# Patient Record
Sex: Female | Born: 1955 | Race: White | Hispanic: No | Marital: Single | Smoking: Never smoker
Health system: Southern US, Community
[De-identification: ages and names within clinical notes are randomized; demographics above are authoritative.]

## PROBLEM LIST (undated history)

## (undated) DIAGNOSIS — F419 Anxiety disorder, unspecified: Secondary | ICD-10-CM

## (undated) DIAGNOSIS — M1990 Unspecified osteoarthritis, unspecified site: Secondary | ICD-10-CM

## (undated) DIAGNOSIS — F329 Major depressive disorder, single episode, unspecified: Secondary | ICD-10-CM

## (undated) DIAGNOSIS — M419 Scoliosis, unspecified: Secondary | ICD-10-CM

## (undated) DIAGNOSIS — F32A Depression, unspecified: Secondary | ICD-10-CM

## (undated) HISTORY — PX: ENDOMETRIAL ABLATION: SHX621

---

## 1998-05-25 ENCOUNTER — Emergency Department (HOSPITAL_COMMUNITY): Admission: EM | Admit: 1998-05-25 | Discharge: 1998-05-25 | Payer: Self-pay | Admitting: Emergency Medicine

## 1998-10-22 ENCOUNTER — Ambulatory Visit (HOSPITAL_COMMUNITY): Admission: RE | Admit: 1998-10-22 | Discharge: 1998-10-22 | Payer: Self-pay | Admitting: Obstetrics and Gynecology

## 1998-10-22 ENCOUNTER — Encounter (INDEPENDENT_AMBULATORY_CARE_PROVIDER_SITE_OTHER): Payer: Self-pay | Admitting: Specialist

## 1999-10-14 ENCOUNTER — Ambulatory Visit (HOSPITAL_COMMUNITY): Admission: RE | Admit: 1999-10-14 | Discharge: 1999-10-14 | Payer: Self-pay | Admitting: Psychiatry

## 1999-10-22 ENCOUNTER — Ambulatory Visit (HOSPITAL_COMMUNITY): Admission: RE | Admit: 1999-10-22 | Discharge: 1999-10-22 | Payer: Self-pay | Admitting: Psychiatry

## 1999-11-01 ENCOUNTER — Ambulatory Visit (HOSPITAL_COMMUNITY): Admission: RE | Admit: 1999-11-01 | Discharge: 1999-11-01 | Payer: Self-pay | Admitting: Psychiatry

## 1999-11-15 ENCOUNTER — Ambulatory Visit (HOSPITAL_COMMUNITY): Admission: RE | Admit: 1999-11-15 | Discharge: 1999-11-15 | Payer: Self-pay | Admitting: Psychiatry

## 1999-11-30 ENCOUNTER — Other Ambulatory Visit: Admission: RE | Admit: 1999-11-30 | Discharge: 1999-11-30 | Payer: Self-pay | Admitting: Obstetrics and Gynecology

## 2000-04-04 ENCOUNTER — Other Ambulatory Visit: Admission: RE | Admit: 2000-04-04 | Discharge: 2000-04-04 | Payer: Self-pay | Admitting: Obstetrics and Gynecology

## 2000-11-20 ENCOUNTER — Encounter: Admission: RE | Admit: 2000-11-20 | Discharge: 2000-11-20 | Payer: Self-pay | Admitting: Psychiatry

## 2000-12-18 ENCOUNTER — Encounter: Admission: RE | Admit: 2000-12-18 | Discharge: 2000-12-18 | Payer: Self-pay | Admitting: Psychiatry

## 2000-12-19 ENCOUNTER — Other Ambulatory Visit (HOSPITAL_COMMUNITY): Admission: RE | Admit: 2000-12-19 | Discharge: 2000-12-26 | Payer: Self-pay | Admitting: *Deleted

## 2001-01-01 ENCOUNTER — Encounter: Admission: RE | Admit: 2001-01-01 | Discharge: 2001-01-01 | Payer: Self-pay | Admitting: Psychiatry

## 2001-01-08 ENCOUNTER — Encounter: Admission: RE | Admit: 2001-01-08 | Discharge: 2001-01-08 | Payer: Self-pay | Admitting: *Deleted

## 2001-01-17 ENCOUNTER — Encounter: Admission: RE | Admit: 2001-01-17 | Discharge: 2001-01-17 | Payer: Self-pay | Admitting: *Deleted

## 2001-01-24 ENCOUNTER — Encounter: Admission: RE | Admit: 2001-01-24 | Discharge: 2001-01-24 | Payer: Self-pay | Admitting: *Deleted

## 2001-02-07 ENCOUNTER — Encounter: Admission: RE | Admit: 2001-02-07 | Discharge: 2001-02-07 | Payer: Self-pay | Admitting: *Deleted

## 2001-02-12 ENCOUNTER — Encounter: Admission: RE | Admit: 2001-02-12 | Discharge: 2001-02-12 | Payer: Self-pay | Admitting: *Deleted

## 2001-02-16 ENCOUNTER — Encounter: Admission: RE | Admit: 2001-02-16 | Discharge: 2001-02-16 | Payer: Self-pay | Admitting: *Deleted

## 2001-02-23 ENCOUNTER — Encounter: Admission: RE | Admit: 2001-02-23 | Discharge: 2001-02-23 | Payer: Self-pay | Admitting: *Deleted

## 2001-03-12 ENCOUNTER — Encounter: Admission: RE | Admit: 2001-03-12 | Discharge: 2001-03-12 | Payer: Self-pay | Admitting: *Deleted

## 2001-05-21 ENCOUNTER — Encounter: Admission: RE | Admit: 2001-05-21 | Discharge: 2001-05-21 | Payer: Self-pay | Admitting: Psychiatry

## 2001-06-07 ENCOUNTER — Encounter: Payer: Self-pay | Admitting: Obstetrics and Gynecology

## 2001-06-07 ENCOUNTER — Encounter: Admission: RE | Admit: 2001-06-07 | Discharge: 2001-06-07 | Payer: Self-pay | Admitting: Obstetrics and Gynecology

## 2001-06-20 ENCOUNTER — Encounter (INDEPENDENT_AMBULATORY_CARE_PROVIDER_SITE_OTHER): Payer: Self-pay | Admitting: *Deleted

## 2001-06-20 ENCOUNTER — Ambulatory Visit (HOSPITAL_BASED_OUTPATIENT_CLINIC_OR_DEPARTMENT_OTHER): Admission: RE | Admit: 2001-06-20 | Discharge: 2001-06-20 | Payer: Self-pay | Admitting: General Surgery

## 2001-07-05 ENCOUNTER — Other Ambulatory Visit: Admission: RE | Admit: 2001-07-05 | Discharge: 2001-07-05 | Payer: Self-pay | Admitting: Obstetrics and Gynecology

## 2002-02-12 ENCOUNTER — Encounter: Admission: RE | Admit: 2002-02-12 | Discharge: 2002-02-12 | Payer: Self-pay | Admitting: Psychiatry

## 2002-02-14 ENCOUNTER — Encounter: Admission: RE | Admit: 2002-02-14 | Discharge: 2002-02-14 | Payer: Self-pay | Admitting: Psychiatry

## 2002-02-19 ENCOUNTER — Encounter: Admission: RE | Admit: 2002-02-19 | Discharge: 2002-02-19 | Payer: Self-pay | Admitting: Psychiatry

## 2002-02-20 ENCOUNTER — Other Ambulatory Visit: Admission: RE | Admit: 2002-02-20 | Discharge: 2002-02-20 | Payer: Self-pay | Admitting: Obstetrics and Gynecology

## 2002-02-27 ENCOUNTER — Encounter: Admission: RE | Admit: 2002-02-27 | Discharge: 2002-02-27 | Payer: Self-pay | Admitting: *Deleted

## 2002-03-01 ENCOUNTER — Encounter: Admission: RE | Admit: 2002-03-01 | Discharge: 2002-03-01 | Payer: Self-pay | Admitting: Psychiatry

## 2002-03-11 ENCOUNTER — Encounter: Admission: RE | Admit: 2002-03-11 | Discharge: 2002-03-11 | Payer: Self-pay | Admitting: Psychiatry

## 2002-03-18 ENCOUNTER — Encounter: Admission: RE | Admit: 2002-03-18 | Discharge: 2002-03-18 | Payer: Self-pay | Admitting: Psychiatry

## 2002-04-12 ENCOUNTER — Encounter: Admission: RE | Admit: 2002-04-12 | Discharge: 2002-04-12 | Payer: Self-pay | Admitting: *Deleted

## 2003-06-09 ENCOUNTER — Emergency Department (HOSPITAL_COMMUNITY): Admission: EM | Admit: 2003-06-09 | Discharge: 2003-06-09 | Payer: Self-pay | Admitting: Emergency Medicine

## 2003-12-26 ENCOUNTER — Other Ambulatory Visit: Admission: RE | Admit: 2003-12-26 | Discharge: 2003-12-26 | Payer: Self-pay | Admitting: Obstetrics and Gynecology

## 2005-01-07 ENCOUNTER — Other Ambulatory Visit: Admission: RE | Admit: 2005-01-07 | Discharge: 2005-01-07 | Payer: Self-pay | Admitting: Obstetrics and Gynecology

## 2007-08-06 ENCOUNTER — Emergency Department (HOSPITAL_COMMUNITY): Admission: EM | Admit: 2007-08-06 | Discharge: 2007-08-06 | Payer: Self-pay | Admitting: Emergency Medicine

## 2007-09-19 ENCOUNTER — Emergency Department (HOSPITAL_COMMUNITY): Admission: EM | Admit: 2007-09-19 | Discharge: 2007-09-19 | Payer: Self-pay | Admitting: Emergency Medicine

## 2008-11-12 ENCOUNTER — Emergency Department (HOSPITAL_COMMUNITY): Admission: EM | Admit: 2008-11-12 | Discharge: 2008-11-12 | Payer: Self-pay | Admitting: Emergency Medicine

## 2010-01-31 ENCOUNTER — Encounter: Payer: Self-pay | Admitting: Obstetrics and Gynecology

## 2010-05-28 NOTE — Op Note (Signed)
[place]. [facility]  Patient:    Ann Scott, Ann Scott Visit Number: [id number] MRN: [id number]          Service Type: DSU Location: [facility] Attending Physician:  Caleen Essex Dictated by:   Chevis Pretty, M.D. Proc. Date: 06/20/01 Admit Date:  06/20/2001 Discharge Date: 06/20/2001                             Operative Report  PREOPERATIVE DIAGNOSIS:  Persistent redness of the left nipple.  POSTOPERATIVE DIAGNOSIS: Persistent redness of the left nipple.  OPERATION PERFORMED:  Wedge biopsy of the left nipple.  SURGEON:  Chevis Pretty, M.D.  ANESTHESIA:  Local.  DESCRIPTION OF PROCEDURE:  After informed consent was obtained, the patient was brought to the operating room and placed in supine position on the operating table. After the patients left breast area was prepped with Betadine and draped in the usual sterile manner, the nipple areolar complex was infiltrated with 1% lidocaine with epinephrine.  The reddened area of the nipple was in the upper outer quadrant of the nipple.  This area was incised in the wedge fashion with a 15 blade knife and the specimen was sent to pathology.  A single 5-0 Monocryl stitch was used to reapproximate the edges of the skin and a Dermabond dressing was applied.  The patient tolerated the procedure well.  Sterile dressings were then applied when the Dermabond was dry.  At the end of the case all sponge, needle and instrument counts were correct.  The patient was then awakened and taken to the recovery room in stable condition. Dictated by:   Chevis Pretty, M.D. Attending Physician:  Caleen Essex DD:  06/24/01 TD:  06/26/01 Job: [id number]

## 2010-10-08 LAB — DIFFERENTIAL
Basophils Absolute: 0
Basophils Relative: 0
Eosinophils Absolute: 0.1
Eosinophils Relative: 2
Lymphocytes Relative: 29
Lymphs Abs: 2.6
Monocytes Absolute: 0.5
Monocytes Relative: 6
Neutro Abs: 5.8
Neutrophils Relative %: 64

## 2010-10-08 LAB — COMPREHENSIVE METABOLIC PANEL
ALT: 21
AST: 23
Albumin: 4
Alkaline Phosphatase: 65
BUN: 17
CO2: 27
Calcium: 10.3
Chloride: 104
Creatinine, Ser: 0.84
GFR calc Af Amer: >60
GFR calc non Af Amer: >60
Glucose, Bld: 97
Potassium: 4.2
Sodium: 139
Total Bilirubin: 0.6
Total Protein: 7

## 2010-10-08 LAB — URINALYSIS, ROUTINE W REFLEX MICROSCOPIC
Bilirubin Urine: NEGATIVE
Glucose, UA: NEGATIVE
Hgb urine dipstick: NEGATIVE
Ketones, ur: NEGATIVE
Nitrite: NEGATIVE
Protein, ur: NEGATIVE
Specific Gravity, Urine: 1.02
Urobilinogen, UA: 0.2
pH: 7

## 2010-10-08 LAB — CBC
HCT: 39.9
Hemoglobin: 13.2
MCHC: 33
MCV: 91.1
Platelets: 280
RBC: 4.38
RDW: 12.6
WBC: 9.2

## 2010-10-08 LAB — URINE MICROSCOPIC-ADD ON

## 2011-04-30 ENCOUNTER — Emergency Department (HOSPITAL_COMMUNITY): Payer: Self-pay

## 2011-04-30 ENCOUNTER — Encounter (HOSPITAL_COMMUNITY): Payer: Self-pay

## 2011-04-30 ENCOUNTER — Emergency Department (HOSPITAL_COMMUNITY)
Admission: EM | Admit: 2011-04-30 | Discharge: 2011-05-01 | Disposition: A | Discharge status: Home / Self Care | Payer: Self-pay | Attending: Emergency Medicine | Admitting: Emergency Medicine

## 2011-04-30 ENCOUNTER — Emergency Department (HOSPITAL_COMMUNITY)
Admission: EM | Admit: 2011-04-30 | Discharge: 2011-04-30 | Disposition: A | Discharge status: Home / Self Care | Payer: Self-pay | Attending: Emergency Medicine | Admitting: Emergency Medicine

## 2011-04-30 ENCOUNTER — Encounter (HOSPITAL_COMMUNITY): Payer: Self-pay | Admitting: *Deleted

## 2011-04-30 DIAGNOSIS — S0990XA Unspecified injury of head, initial encounter: Secondary | ICD-10-CM | POA: Insufficient documentation

## 2011-04-30 DIAGNOSIS — Y93E1 Activity, personal bathing and showering: Secondary | ICD-10-CM | POA: Insufficient documentation

## 2011-04-30 DIAGNOSIS — W19XXXA Unspecified fall, initial encounter: Secondary | ICD-10-CM | POA: Insufficient documentation

## 2011-04-30 DIAGNOSIS — R51 Headache: Secondary | ICD-10-CM | POA: Insufficient documentation

## 2011-04-30 DIAGNOSIS — W1809XA Striking against other object with subsequent fall, initial encounter: Secondary | ICD-10-CM | POA: Insufficient documentation

## 2011-04-30 DIAGNOSIS — Y92009 Unspecified place in unspecified non-institutional (private) residence as the place of occurrence of the external cause: Secondary | ICD-10-CM | POA: Insufficient documentation

## 2011-04-30 DIAGNOSIS — F341 Dysthymic disorder: Secondary | ICD-10-CM | POA: Insufficient documentation

## 2011-04-30 HISTORY — DX: Depression, unspecified: F32.A

## 2011-04-30 HISTORY — DX: Major depressive disorder, single episode, unspecified: F32.9

## 2011-04-30 HISTORY — DX: Anxiety disorder, unspecified: F41.9

## 2011-04-30 MED ORDER — IBUPROFEN 800 MG PO TABS: 800.0000 mg | ORAL_TABLET | Freq: Three times a day (TID) | ORAL | Status: AC

## 2011-04-30 NOTE — ED Provider Notes (Signed)
History     CSN: [id number]  Arrival date & time 04/30/11  1129   First MD Initiated Contact with Patient 04/30/11 1143      Chief Complaint  Patient presents with  . Headache  . Fall    (Consider location/radiation/quality/duration/timing/severity/associated sxs/prior treatment) HPI Patient presents to the ER after falling hitting her head this morning in the bath tub. The patient has no LOC. She denies visual changes, N/V, CP, SOB, weakness, neck pain, gait disturbance, dizziness, or numbness. She states that the side of her head feels "like she hit it". The patient states that she did not take anything for her head. She states that nothing seems to make the sensation better or worse. The patient is very anxious about something being wrong with her brain. Past Medical History  Diagnosis Date  . Anxiety   . Depression     Past Surgical History  Procedure Date  . Endometrial ablation     History reviewed. No pertinent family history.  History  Substance Use Topics  . Smoking status: Never Smoker   . Smokeless tobacco: Not on file  . Alcohol Use: No    OB History    Grav Para Term Preterm Abortions TAB SAB Ect Mult Living                  Review of Systems All pertinent positives and negatives reviewed in the history of present illness  Allergies  Sulfa antibiotics  Home Medications   Current Outpatient Rx  Name Route Sig Dispense Refill  . ASPIRIN 81 MG PO CHEW Oral Chew 81 mg by mouth daily.    . ADULT MULTIVITAMIN W/MINERALS CH Oral Take 1 tablet by mouth daily. Nature made      BP 131/76  Pulse 75  Temp(Src) 97.9 F (36.6 C) (Oral)  Resp 18  SpO2 98%  Physical Exam Physical Examination: General appearance - alert, well appearing, and in no distress, oriented to person, place, and time, well hydrated and anxious Mental status - alert, oriented to person, place, and time, normal mood, behavior, speech, dress, motor activity, and thought processes,  anxious Eyes - pupils equal and reactive, extraocular eye movements intact Ears - bilateral TM's and external ear canals normal Nose - normal and patent, no erythema, discharge or polyps Chest - clear to auscultation, no wheezes, rales or rhonchi, symmetric air entry Heart - normal rate, regular rhythm, normal S1, S2, no murmurs, rubs, clicks or gallops Neurological - alert, oriented, normal speech, no focal findings or movement disorder noted, neck supple without rigidity, DTR's normal and symmetric, motor and sensory grossly normal bilaterally, normal muscle tone, no tremors, strength 5/5 Musculoskeletal - no joint tenderness, deformity or swelling  ED Course  Procedures (including critical care time)  Labs Reviewed - No data to display Ct Head Wo Contrast  04/30/2011  *RADIOLOGY REPORT*  Clinical Data:  Larey Seat in bathtub, right-sided head trauma, headache. No loss of consciousness.  CT HEAD WITHOUT CONTRAST  Technique:  Contiguous axial images were obtained from the base of the skull through the vertex without contrast  Comparison:  None.  Findings:  The brain has a normal appearance without evidence for hemorrhage, acute infarction, hydrocephalus, or mass lesion.  There is no extra axial fluid collection.  The skull and paranasal sinuses are normal. Small hypodensity centrally within the brain displaying a pinwheel type pattern is artifactual.  IMPRESSION: Normal CT of the head without contrast.  Original Report Authenticated By: Elsie Stain,  M.D.   The patient has minor head injury. She is very anxious about this. She has no outward signs of trauma on exam. The patient is intact neurologically. She has not visual changes. She will be referred back to her PCP.   The patient has been stable here in the ER. She is asked to return here for any worsening in her condition.  Filed Vitals:   04/30/11 1136 04/30/11 1345  BP: 131/76 113/57  Pulse: 75 71  Temp: 97.9 F (36.6 C)   TempSrc: Oral     Resp: 18 18  SpO2: 98% 100%     MDM  MDM Reviewed: vitals and nursing note Interpretation: CT scan            Carlyle Dolly, PA-C 05/01/11 [phone]

## 2011-04-30 NOTE — Discharge Instructions (Signed)
REturn here as needed. Follow up with your doctor. Your head CT was normal.

## 2011-04-30 NOTE — ED Notes (Signed)
Pt in ED earlier today with fall.  C/O "doesn't feel right".  Says her speech feels difficult.  Modified assessment:  Smile equal, raises eyebrows equal.  Speech clear at this time.  Pt ambulation normal.  Pt walked here from home.  Pt also states could be anxiety from previous fall.  Notified Triage RN of assessment.

## 2011-04-30 NOTE — ED Notes (Signed)
Patient transported to CT 

## 2011-04-30 NOTE — ED Notes (Signed)
Pt accidentally fell in bath tube, hit right side of head, no LOC, on ASA, c/o aching pain 5/10 at right temporal area, no bleeding, hematoma, or nausea

## 2011-04-30 NOTE — ED Notes (Signed)
Pt fell in bathtub, struck R side of head, evaluated for injury earlier. Pt presents w/ c/o "feeling strange" this afternoon.

## 2011-05-01 NOTE — ED Provider Notes (Signed)
History     CSN: [id number]  Arrival date & time 04/30/11  2218   First MD Initiated Contact with Patient 04/30/11 2354      Chief Complaint  Patient presents with  . Head Injury    (Consider location/radiation/quality/duration/timing/severity/associated sxs/prior treatment) HPI  Pt presents to he ED with complaints of head injury. She was seen in the hospital earlier today and had a CT which was normal. She admits to being a very anxious person and being "liberal arts" and not understanding this "science and medicine stuff". She says that she hit her head on the bathtub. She denies LOC. She denies vomiting, confusion and blurry vision at the time. She states that sent leaving the hospital she has felt "weird" and "strange". She states that her head feels "tight". Her head hit on the right parietal side. She walked 2 miles from home to come to the ED to be re-evaluated.   Past Medical History  Diagnosis Date  . Anxiety   . Depression     Past Surgical History  Procedure Date  . Endometrial ablation     History reviewed. No pertinent family history.  History  Substance Use Topics  . Smoking status: Never Smoker   . Smokeless tobacco: Not on file  . Alcohol Use: No    OB History    Grav Para Term Preterm Abortions TAB SAB Ect Mult Living                  Review of Systems  HEENT: denies blurry vision or change in hearing PULMONARY: Denies difficulty breathing and SOB CARDIAC: denies chest pain or heart palpitations MUSCULOSKELETAL:  denies being unable to ambulate ABDOMEN AL: denies abdominal pain GU: denies loss of bowel or urinary control NEURO: denies numbness and tingling in extremities, denies blurry vision, weakness in her extremities.   Allergies  Sulfa antibiotics  Home Medications   Current Outpatient Rx  Name Route Sig Dispense Refill  . ASPIRIN 81 MG PO CHEW Oral Chew 81 mg by mouth daily.    . IBUPROFEN 800 MG PO TABS Oral Take 1 tablet (800  mg total) by mouth 3 (three) times daily. 21 tablet 0  . ADULT MULTIVITAMIN W/MINERALS CH Oral Take 1 tablet by mouth daily. Nature made      BP 125/77  Pulse 71  Temp(Src) 98.1 F (36.7 C) (Oral)  Resp 22  SpO2 100%  Physical Exam  Nursing note and vitals reviewed. Constitutional: She is oriented to person, place, and time. She appears well-developed and well-nourished. No distress.  HENT:  Head: Normocephalic.  Eyes: Pupils are equal, round, and reactive to light.  Neck: Normal range of motion. Neck supple.  Cardiovascular: Normal rate and regular rhythm.   Pulmonary/Chest: Effort normal.  Abdominal: Soft.  Neurological: She is oriented to person, place, and time. She has normal strength. No sensory deficit. Cranial nerve deficit: 3-12 in tact.  Skin: Skin is warm and dry.    ED Course  Procedures (including critical care time)  Labs Reviewed - No data to display Ct Head Wo Contrast  04/30/2011  *RADIOLOGY REPORT*  Clinical Data:  Larey Seat in bathtub, right-sided head trauma, headache. No loss of consciousness.  CT HEAD WITHOUT CONTRAST  Technique:  Contiguous axial images were obtained from the base of the skull through the vertex without contrast  Comparison:  None.  Findings:  The brain has a normal appearance without evidence for hemorrhage, acute infarction, hydrocephalus, or mass lesion.  There  is no extra axial fluid collection.  The skull and paranasal sinuses are normal. Small hypodensity centrally within the brain displaying a pinwheel type pattern is artifactual.  IMPRESSION: Normal CT of the head without contrast.  Original Report Authenticated By: Elsie Stain, M.D.     1. Head injury       MDM   The patient does not require further imaging. She is not showing any red flag symptoms. The patient is very anxious and needed a lot of reassurance. She is simply scared and does not understand that her head CT was normal and that a concussion will leave you feeling a  little odd. Pt ambulatory and has a normal gait. She walks without difficulty.  Pt has been advised of the symptoms that warrant their return to the ED. Patient has voiced understanding and has agreed to follow-up with the PCP or specialist.        Dorthula Matas, PA 05/01/11 [phone]

## 2011-05-01 NOTE — ED Notes (Signed)
EDPA Tiffany in with this pt

## 2011-05-01 NOTE — Discharge Instructions (Signed)
Concussion and Brain Injury A blow or jolt to the head can disrupt the normal function of the brain. This type of brain injury is often called a "concussion" or a "closed head injury." Concussions are usually not life-threatening. Even so, the effects of a concussion can be serious.  CAUSES  A concussion is caused by a blunt blow to the head. The blow might be direct or indirect as described below.  Direct blow (running into another player during a soccer game, being hit in a fight, or hitting your head on a hard surface).   Indirect blow (when your head moves rapidly and violently back and forth like in a car crash).  SYMPTOMS  The brain is very complex. Every head injury is different. Some symptoms may appear right away. Other symptoms may not show up for days or weeks after the concussion. The signs of concussion can be hard to notice. Early on, problems may be missed by patients, family members, and caregivers. You may look fine even though you are acting or feeling differently.  These symptoms are usually temporary, but may last for days, weeks, or even longer. Symptoms include:  Mild headaches that will not go away.   Having more trouble than usual with:   Remembering things.   Paying attention or concentrating.   Organizing daily tasks.   Making decisions and solving problems.   Slowness in thinking, acting, speaking, or reading.   Getting lost or easily confused.   Feeling tired all the time or lacking energy (fatigue).   Feeling drowsy.   Sleep disturbances.   Sleeping more than usual.   Sleeping less than usual.   Trouble falling asleep.   Trouble sleeping (insomnia).   Loss of balance or feeling lightheaded or dizzy.   Nausea or vomiting.   Numbness or tingling.   Increased sensitivity to:   Sounds.   Lights.   Distractions.  Other symptoms might include:  Vision problems or eyes that tire easily.   Diminished sense of taste or smell.   Ringing  in the ears.   Mood changes such as feeling sad, anxious, or listless.   Becoming easily irritated or angry for little or no reason.   Lack of motivation.  DIAGNOSIS  Your caregiver can usually diagnose a concussion or mild brain injury based on your description of your injury and your symptoms.  Your evaluation might include:  A brain scan to look for signs of injury to the brain. Even if the test shows no injury, you may still have a concussion.   Blood tests to be sure other problems are not present.  TREATMENT   People with a concussion need to be examined and evaluated. Most people with concussions are treated in an emergency department, urgent care, or clinic. Some people must stay in the hospital overnight for further treatment.   Your caregiver will send you home with important instructions to follow. Be sure to carefully follow them.   Tell your caregiver if you are already taking any medicines (prescription, over-the-counter, or natural remedies), or if you are drinking alcohol or taking illegal drugs. Also, talk with your caregiver if you are taking blood thinners (anticoagulants) or aspirin. These drugs may increase your chances of complications. All of this is important information that may affect treatment.   Only take over-the-counter or prescription medicines for pain, discomfort, or fever as directed by your caregiver.  PROGNOSIS  How fast people recover from brain injury varies from person to person.   Although most people have a good recovery, how quickly they improve depends on many factors. These factors include how severe their concussion was, what part of the brain was injured, their age, and how healthy they were before the concussion.  Because all head injuries are different, so is recovery. Most people with mild injuries recover fully. Recovery can take time. In general, recovery is slower in older persons. Also, persons who have had a concussion in the past or have  other medical problems may find that it takes longer to recover from their current injury. Anxiety and depression may also make it harder to adjust to the symptoms of brain injury. HOME CARE INSTRUCTIONS  Return to your normal activities slowly, not all at once. You must give your body and brain enough time for recovery.  Get plenty of sleep at night, and rest during the day. Rest helps the brain to heal.   Avoid staying up late at night.   Keep the same bedtime hours on weekends and weekdays.   Take daytime naps or rest breaks when you feel tired.   Limit activities that require a lot of thought or concentration (brain or cognitive rest). This includes:   Homework or job-related work.   Watching TV.   Computer work.   Avoid activities that could lead to a second brain injury, such as contact or recreational sports, until your caregiver says it is okay. Even after your brain injury has healed, you should protect yourself from having another concussion.   Ask your caregiver when you can return to your normal activities such as driving, bicycling, or operating heavy equipment. Your ability to react may be slower after a brain injury.   Talk with your caregiver about when you can return to work or school.   Inform your teachers, school nurse, school counselor, coach, athletic trainer, or work manager about your injury, symptoms, and restrictions. They should be instructed to report:   Increased problems with attention or concentration.   Increased problems remembering or learning new information.   Increased time needed to complete tasks or assignments.   Increased irritability or decreased ability to cope with stress.   Increased symptoms.   Take only those medicines that your caregiver has approved.   Do not drink alcohol until your caregiver says you are well enough to do so. Alcohol and certain other drugs may slow your recovery and can put you at risk of further injury.    If it is harder than usual to remember things, write them down.   If you are easily distracted, try to do one thing at a time. For example, do not try to watch TV while fixing dinner.   Talk with family members or close friends when making important decisions.   Keep all follow-up appointments. Repeated evaluation of your symptoms is recommended for your recovery.  PREVENTION  Protect your head from future injury. It is very important to avoid another head or brain injury before you have recovered. In rare cases, another injury has lead to permanent brain damage, brain swelling, or death. Avoid injuries by using:  Seatbelts when riding in a car.   Alcohol only in moderation.   A helmet when biking, skiing, skateboarding, skating, or doing similar activities.   Safety measures in your home.   Remove clutter and tripping hazards from floors and stairways.   Use grab bars in bathrooms and handrails by stairs.   Place non-slip mats on floors and in bathtubs.     Improve lighting in dim areas.  SEEK MEDICAL CARE IF:  A head injury can cause lingering symptoms. You should seek medical care if you have any of the following symptoms for more than 3 weeks after your injury or are planning to return to sports:  Chronic headaches.   Dizziness or balance problems.   Nausea.   Vision problems.   Increased sensitivity to noise or light.   Depression or mood swings.   Anxiety or irritability.   Memory problems.   Difficulty concentrating or paying attention.   Sleep problems.   Feeling tired all the time.  SEEK IMMEDIATE MEDICAL CARE IF:  You have had a blow or jolt to the head and you (or your family or friends) notice:  Severe or worsening headaches.   Weakness (even if only in one hand or one leg or one part of the face), numbness, or decreased coordination.   Repeated vomiting.   Increased sleepiness or passing out.   One black center of the eye (pupil) is larger  than the other.   Convulsions (seizures).   Slurred speech.   Increasing confusion, restlessness, agitation, or irritability.   Lack of ability to recognize people or places.   Neck pain.   Difficulty being awakened.   Unusual behavior changes.   Loss of consciousness.  Older adults with a brain injury may have a higher risk of serious complications such as a blood clot on the brain. Headaches that get worse or an increase in confusion are signs of this complication. If these signs occur, see a caregiver right away. MAKE SURE YOU:   Understand these instructions.   Will watch your condition.   Will get help right away if you are not doing well or get worse.  FOR MORE INFORMATION  Several groups help people with brain injury and their families. They provide information and put people in touch with local resources. These include support groups, rehabilitation services, and a variety of health care professionals. Among these groups, the Brain Injury Association (BIA, www.biausa.org) has a national office that gathers scientific and educational information and works on a national level to help people with brain injury.  Document Released: 03/19/2003 Document Revised: 12/16/2010 Document Reviewed: 08/15/2007 [employer] Patient Information 2012 [employer]. 

## 2011-05-01 NOTE — ED Provider Notes (Signed)
Medical screening examination/treatment/procedure(s) were performed by non-physician practitioner and as supervising physician I was immediately available for consultation/collaboration.   Drey Shaff L Gaelle Adriance, MD 05/01/11 0812 

## 2011-05-02 NOTE — ED Provider Notes (Signed)
Medical screening examination/treatment/procedure(s) were performed by non-physician practitioner and as supervising physician I was immediately available for consultation/collaboration.    Nelia Shi, MD 05/02/11 [phone]

## 2011-05-26 ENCOUNTER — Telehealth (HOSPITAL_COMMUNITY): Payer: Self-pay | Admitting: *Deleted

## 2011-05-26 ENCOUNTER — Encounter (HOSPITAL_COMMUNITY): Payer: Self-pay | Admitting: *Deleted

## 2011-05-26 ENCOUNTER — Ambulatory Visit (HOSPITAL_COMMUNITY)
Admission: RE | Admit: 2011-05-26 | Discharge: 2011-05-26 | Disposition: A | Discharge status: Home / Self Care | Payer: Self-pay | Attending: Psychiatry | Admitting: Psychiatry

## 2011-05-26 DIAGNOSIS — F39 Unspecified mood [affective] disorder: Secondary | ICD-10-CM | POA: Insufficient documentation

## 2011-05-26 DIAGNOSIS — F411 Generalized anxiety disorder: Secondary | ICD-10-CM | POA: Insufficient documentation

## 2011-05-26 NOTE — BH Assessment (Signed)
Assessment Note   Ann Scott is an 56 y.o. female. Pt presents as walk in today hyper verbal and frantic. Reports she rode in car with sister (Whom she chronically feels belittled by) and mother who was returning from surgery and must stay with sister for 2 days.Pt called her therapist today who recommended she come for evaluation and gave her and appointment for 05/27/2011 1200. Pt having racing thoughts but when redirected could focus. Mildly restless, good memory. reasonable insight and judgement. Great difficulty prioritizing and sorting thoughts. Pt denies any suicidal thoughts but would like to run away and start over, or live somewhere else to avoid how overwhelmed she feels . Pt has financial, housing, job and social problems of long standing. She has many fears about medications, disability and age, potential employer's finding out about her mental health problems. Reports "nervous breakdown" in 2004 and has not worked since. At the time they gave her Effexor which caused palpitations," head going to explode", agitation and slurred speech. Also reports poor reaction to Prozac. 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 Pt has never had HI or psychosis, now or in past. Discussed her options, and pt will keep her appointment with the therapist and get an appointment for a medication evaluation. Pt is aware of 24 hour availability of help at [facility] and ED. Given literature about depression/suicidality.  Axis I: Mood Disorder NOS Axis II: Deferred Axis III:  Past Medical History  Diagnosis Date  . Anxiety   . Depression    Axis IV: economic problems, housing problems, occupational problems and problems with primary support group Axis V: 41-50 serious symptoms  Past Medical History:  Past Medical History  Diagnosis Date  . Anxiety   . Depression     Past Surgical History  Procedure Date  . Endometrial ablation     Family History: No family  history on file.  Social History:  reports that she has never smoked. She has never used smokeless tobacco. She reports that she does not drink alcohol or use illicit drugs.  Additional Social History:  Alcohol / Drug Use Pain Medications: not abusing Prescriptions: not abusing Over the Counter: asa History of alcohol / drug use?: No history of alcohol / drug abuse Allergies:  Allergies  Allergen Reactions  . Effexor (Venlafaxine) Anxiety, Palpitations and Other (See Comments)    Slurred speech  . Prozac (Fluoxetine) Itching and Anxiety  . Sulfa Antibiotics Itching and Rash    Home Medications:  (Not in a hospital admission)  OB/GYN Status:  No LMP recorded. Patient is postmenopausal.  General Assessment Data Location of Assessment: [facility] Services Living Arrangements: Alone Can pt return to current living arrangement?: Yes Referral Source: Other Dentist, Amanda)  Education Status Is patient currently in school?: No Highest grade of school patient has completed: BA Contact person: mother  Risk to self Suicidal Ideation: No-Not Currently/Within Last 6 Months (fleeting thoughts to give up, run away) Suicidal Intent: No-Not Currently/Within Last 6 Months Is patient at risk for suicide?: No Suicidal Plan?: No Access to Means: No What has been your use of drugs/alcohol within the last 12 months?: none Previous Attempts/Gestures: No How many times?: 0  Other Self Harm Risks: 0 Triggers for Past Attempts: Family contact Intentional Self Injurious Behavior: None Family Suicide History: No Recent stressful life event(s): Conflict (Comment) (sister is demeaning) Persecutory voices/beliefs?: No Depression: Yes Depression Symptoms: Fatigue;Guilt;Feeling worthless/self pity Substance abuse history and/or  treatment for substance abuse?: No Suicide prevention information given to non-admitted patients: Yes  Risk to Others Homicidal Ideation: No Thoughts of Harm  to Others: No Current Homicidal Intent: No Current Homicidal Plan: No Access to Homicidal Means: No History of harm to others?: No Assessment of Violence: None Noted Does patient have access to weapons?: No Criminal Charges Pending?: No Does patient have a court date: No  Psychosis Hallucinations: None noted Delusions: None noted  Mental Status Report Appear/Hygiene: Other (Comment) (clean, casual but non descript) Eye Contact: Good Motor Activity: Restlessness Speech: Rapid;Logical/coherent Level of Consciousness: Alert Mood: Anxious;Apprehensive Affect: Anxious;Apprehensive Anxiety Level: Moderate Thought Processes: Coherent;Relevant (mildly racing) Judgement: Impaired Orientation: Person;Place;Time;Situation Obsessive Compulsive Thoughts/Behaviors: Minimal  Cognitive Functioning Concentration: Decreased Memory: Recent Intact;Remote Intact IQ: Average Insight: Fair Impulse Control: Good Appetite: Good Weight Loss: 0  Weight Gain: 0  Sleep: No Change Total Hours of Sleep: 6  Vegetative Symptoms: None  Prior Inpatient Therapy Prior Inpatient Therapy: No  Prior Outpatient Therapy Prior Outpatient Therapy: Yes Prior Therapy Dates: current Prior Therapy Facilty/Provider(s): [facility] Reason for Treatment: anxiety depression  ADL Screening (condition at time of admission) Patient's cognitive ability adequate to safely complete daily activities?: Yes Patient able to express need for assistance with ADLs?: Yes Independently performs ADLs?: Yes Weakness of Legs: None Weakness of Arms/Hands: None  Home Assistive Devices/Equipment Home Assistive Devices/Equipment: Eyeglasses    Abuse/Neglect Assessment (Assessment to be complete while patient is alone) Physical Abuse: Denies Verbal Abuse: Yes, present (Comment) (sister is demeaning) Sexual Abuse: Denies Exploitation of patient/patient's resources: Denies Self-Neglect: Denies       Nutrition  Screen Diet: Regular Unintentional weight loss greater than 10lbs within the last month: No Problems chewing or swallowing foods and/or liquids: No Home Tube Feeding or Total Parenteral Nutrition (TPN): No Patient appears severely malnourished: No Pregnant or Lactating: No  Additional Information 1:1 In Past 12 Months?: No CIRT Risk: No Elopement Risk: No Does patient have medical clearance?: No     Disposition:  Disposition Disposition of Patient: Outpatient treatment;Other dispositions Type of outpatient treatment: Adult Other disposition(s): To current provider;Other (Comment) (request to see medication provider)  On Site Evaluation by:   Reviewed with Physician:     Conan Bowens 05/26/2011 7:54 PM

## 2011-10-01 ENCOUNTER — Ambulatory Visit (INDEPENDENT_AMBULATORY_CARE_PROVIDER_SITE_OTHER): Payer: Self-pay | Admitting: Internal Medicine

## 2011-10-01 VITALS — BP 131/73 | HR 57 | Temp 98.8°F | Resp 18 | Ht 62.0 in | Wt 125.0 lb

## 2011-10-01 DIAGNOSIS — Z0289 Encounter for other administrative examinations: Secondary | ICD-10-CM

## 2011-10-01 NOTE — Progress Notes (Signed)
  Subjective:    Patient ID: Ann Scott, female    DOB: 11-10-1955, 56 y.o.   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 Were you born outside the [place] in one of the following parts of the world:    [place], [place], [place], [place] or [place]?  No  2. Have you traveled outside the [place] and lived for more than one month in one of the following parts of the world:  [place], [place], [place], [place] or [place]?  No  3. Do you have a compromised immune system such as from any of the following conditions:  HIV/AIDS, organ or bone marrow transplantation, diabetes, immunosuppressive   medicines (e.g. Prednisone, Remicaide), leukemia, lymphoma, cancer of the   head or neck, gastrectomy or jejunal bypass, end-stage renal disease (on   dialysis), or silicosis?  No    4. Have you ever done one of the following:    Used crack cocaine, injected illegal drugs, worked or resided in jail or prison,   worked or resided at a homeless shelter, or worked as a Research scientist (physical sciences) in   direct contact with patients?  No  5. Have you ever been exposed to anyone with infectious tuberculosis?  No Tuberculosis Symptom Questionnaire  Do you currently have any of the following symptoms?  1. Unexplained cough lasting more than 3 weeks? No  Unexplained fever lasting more than 3 weeks. No   3. Night Sweats (sweating that leaves the bedclothes and sheets wet)   No  4. Shortness of Breath No  5. Chest Pain No  6. Unintentional weight loss  No  7. 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

## 2011-12-23 ENCOUNTER — Other Ambulatory Visit: Payer: Self-pay | Admitting: Internal Medicine

## 2011-12-23 DIAGNOSIS — Z1231 Encounter for screening mammogram for malignant neoplasm of breast: Secondary | ICD-10-CM

## 2012-01-30 ENCOUNTER — Ambulatory Visit: Payer: Self-pay

## 2012-07-11 ENCOUNTER — Ambulatory Visit: Payer: Self-pay | Attending: Family Medicine | Admitting: Internal Medicine

## 2012-07-11 VITALS — BP 125/71 | HR 70 | Temp 98.0°F | Resp 16 | Ht 64.0 in | Wt 139.2 lb

## 2012-07-11 DIAGNOSIS — Z0000 Encounter for general adult medical examination without abnormal findings: Secondary | ICD-10-CM

## 2012-07-11 LAB — CBC WITH DIFFERENTIAL/PLATELET
Basophils Absolute: 0 10*3/uL (ref 0.0–0.1)
Basophils Relative: 1 % (ref 0–1)
Eosinophils Absolute: 0.1 10*3/uL (ref 0.0–0.7)
Eosinophils Relative: 2 % (ref 0–5)
MCH: 29.7 pg (ref 26.0–34.0)
MCHC: 34.9 g/dL (ref 30.0–36.0)
MCV: 84.9 fL (ref 78.0–100.0)
Monocytes Absolute: 0.5 10*3/uL (ref 0.1–1.0)
Platelets: 330 10*3/uL (ref 150–400)
RDW: 13.8 % (ref 11.5–15.5)
WBC: 7.2 10*3/uL (ref 4.0–10.5)

## 2012-07-11 LAB — BASIC METABOLIC PANEL
BUN: 21 mg/dL (ref 6–23)
Calcium: 10.3 mg/dL (ref 8.4–10.5)
Creat: 1.2 mg/dL — ABNORMAL HIGH (ref 0.50–1.10)

## 2012-07-11 LAB — LIPID PANEL: Cholesterol: 172 mg/dL (ref 0–200)

## 2012-07-11 LAB — HEMOGLOBIN A1C: Mean Plasma Glucose: 108 mg/dL (ref <117)

## 2012-07-11 MED ORDER — CIPROFLOXACIN HCL 500 MG PO TABS: 500.0000 mg | ORAL_TABLET | Freq: Two times a day (BID) | ORAL | Status: DC

## 2012-07-11 NOTE — Progress Notes (Unsigned)
Patient ID: Ann Scott, female   DOB: September 29, 1955, 57 y.o.   MRN: [id number]  CC:  HPI: 57 year old female who presents for routine physical She is applying for a teaching job. The patient brings in a form to be filled out. The patient is describing Very poor financial situation. She is out of a job. She plans to visit [place] to meet up with an old friend She is currently not sexually active but feels that every time she does get sexually active the patient develops a UTI and is requesting a prescription for ciprofloxacin prophylactically. She is otherwise very healthy. She is up-to-date with her hepatitis B vaccination, she received a DTaP in 2007.   Allergies  Allergen Reactions  . Effexor (Venlafaxine) Anxiety, Palpitations and Other (See Comments)    Slurred speech  . Prozac (Fluoxetine) Itching and Anxiety  . Sulfa Antibiotics Itching and Rash   Past Medical History  Diagnosis Date  . Anxiety   . Depression    Current Outpatient Prescriptions on File Prior to Visit  Medication Sig Dispense Refill  . aspirin 81 MG chewable tablet Chew 81 mg by mouth daily.      . Multiple Vitamin (MULITIVITAMIN WITH MINERALS) TABS Take 1 tablet by mouth daily. Nature made       No current facility-administered medications on file prior to visit.   History reviewed. No pertinent family history. History   Social History  . Marital Status: Single    Spouse Name: N/A    Number of Children: N/A  . Years of Education: N/A   Occupational History  . Not on file.   Social History Main Topics  . Smoking status: Never Smoker   . Smokeless tobacco: Never Used  . Alcohol Use: No  . Drug Use: No  . Sexually Active: No   Other Topics Concern  . Not on file   Social History Narrative  . No narrative on file    Review of Systems  Constitutional: Negative for fever, chills, diaphoresis, activity change, appetite change and fatigue.  HENT: Negative for ear pain, nosebleeds, congestion,  facial swelling, rhinorrhea, neck pain, neck stiffness and ear discharge.   Eyes: Negative for pain, discharge, redness, itching and visual disturbance.  Respiratory: Negative for cough, choking, chest tightness, shortness of breath, wheezing and stridor.   Cardiovascular: Negative for chest pain, palpitations and leg swelling.  Gastrointestinal: Negative for abdominal distention.  Genitourinary: Negative for dysuria, urgency, frequency, hematuria, flank pain, decreased urine volume, difficulty urinating and dyspareunia.  Musculoskeletal: Negative for back pain, joint swelling, arthralgias and gait problem.  Neurological: Negative for dizziness, tremors, seizures, syncope, facial asymmetry, speech difficulty, weakness, light-headedness, numbness and headaches.  Hematological: Negative for adenopathy. Does not bruise/bleed easily.  Psychiatric/Behavioral: Negative for hallucinations, behavioral problems, confusion, dysphoric mood, decreased concentration and agitation.    Objective:   Filed Vitals:   07/11/12 1225  BP: 125/71  Pulse: 70  Temp: 98 F (36.7 C)  Resp: 16    Physical Exam  Constitutional: Appears well-developed and well-nourished. No distress.  HENT: Normocephalic. External right and left ear normal. Oropharynx is clear and moist.  Eyes: Conjunctivae and EOM are normal. PERRLA, no scleral icterus.  Neck: Normal ROM. Neck supple. No JVD. No tracheal deviation. No thyromegaly.  CVS: RRR, S1/S2 +, no murmurs, no gallops, no carotid bruit.  Pulmonary: Effort and breath sounds normal, no stridor, rhonchi, wheezes, rales.  Abdominal: Soft. BS +,  no distension, tenderness, rebound or guarding.  Musculoskeletal: Normal  range of motion. No edema and no tenderness.  Lymphadenopathy: No lymphadenopathy noted, cervical, inguinal. Neuro: Alert. Normal reflexes, muscle tone coordination. No cranial nerve deficit. Skin: Skin is warm and dry. No rash noted. Not diaphoretic. No  erythema. No pallor.  Psychiatric: Normal mood and affect. Behavior, judgment, thought content normal.   Lab Results  Component Value Date   WBC 9.2 08/06/2007   HGB 13.2 08/06/2007   HCT 39.9 08/06/2007   MCV 91.1 08/06/2007   PLT 280 08/06/2007   Lab Results  Component Value Date   CREATININE 0.84 08/06/2007   BUN 17 08/06/2007   NA 139 08/06/2007   K 4.2 08/06/2007   CL 104 08/06/2007   CO2 27 08/06/2007    No results found for this basename: HGBA1C   Lipid Panel  No results found for this basename: chol, trig, hdl, cholhdl, vldl, ldlcalc       Assessment and plan:   There are no active problems to display for this patient.  Routine physical exam #1 we'll provide a TdaP vaccine #2 obtain a hepatitis B antibody titer dementia the patient still protected #3 obtain routine blood work for CBC BMP, hemoglobin A1c, lipid panel #4 patient has been provided with a prescription for ciprofloxacin to be used prophylactically.  #5 patient instructed to call to obtain results of her TB test  #6 routine followup in 2 months

## 2012-07-11 NOTE — Progress Notes (Unsigned)
Patient here to establish care and get a physical

## 2012-07-11 NOTE — Patient Instructions (Signed)
Patient instructed to call in 2 days to obtain results of her TB test

## 2012-07-12 LAB — HEPATITIS B CORE ANTIBODY, TOTAL: Hep B Core Total Ab: NEGATIVE

## 2012-07-13 LAB — QUANTIFERON TB GOLD ASSAY (BLOOD)
Interferon Gamma Release Assay: NEGATIVE
Quantiferon Nil Value: 0.03 IU/mL
TB Ag value: 0.19 IU/mL

## 2012-07-24 ENCOUNTER — Telehealth: Payer: Self-pay | Admitting: Family Medicine

## 2012-07-24 NOTE — Telephone Encounter (Signed)
Pt called to ask a question about vaginal discomfort during intercourse and wanted to know if there was an OTC medication she could use to relieve her issue.

## 2012-07-24 NOTE — Telephone Encounter (Signed)
Patient is out of town- was inquiring about vaginal lubricant Since menopause very dry in her vaginal area i instructed her to speak with pharmacist in the area she is staying

## 2012-09-11 ENCOUNTER — Ambulatory Visit: Payer: Self-pay

## 2013-04-01 ENCOUNTER — Ambulatory Visit: Payer: Self-pay

## 2013-05-23 ENCOUNTER — Ambulatory Visit: Payer: No Typology Code available for payment source | Attending: Internal Medicine

## 2013-06-12 ENCOUNTER — Ambulatory Visit: Payer: Self-pay | Admitting: Internal Medicine

## 2013-06-18 ENCOUNTER — Ambulatory Visit: Payer: No Typology Code available for payment source | Attending: Internal Medicine | Admitting: Internal Medicine

## 2013-06-18 ENCOUNTER — Encounter: Payer: Self-pay | Admitting: Internal Medicine

## 2013-06-18 VITALS — BP 119/67 | HR 71 | Temp 97.9°F | Resp 12 | Ht 64.0 in | Wt 131.4 lb

## 2013-06-18 DIAGNOSIS — R5381 Other malaise: Secondary | ICD-10-CM | POA: Insufficient documentation

## 2013-06-18 DIAGNOSIS — Z0289 Encounter for other administrative examinations: Secondary | ICD-10-CM | POA: Insufficient documentation

## 2013-06-18 DIAGNOSIS — Z0000 Encounter for general adult medical examination without abnormal findings: Secondary | ICD-10-CM

## 2013-06-18 DIAGNOSIS — M4120 Other idiopathic scoliosis, site unspecified: Secondary | ICD-10-CM | POA: Insufficient documentation

## 2013-06-18 DIAGNOSIS — R5383 Other fatigue: Secondary | ICD-10-CM

## 2013-06-18 DIAGNOSIS — L659 Nonscarring hair loss, unspecified: Secondary | ICD-10-CM | POA: Insufficient documentation

## 2013-06-18 NOTE — Progress Notes (Signed)
Patient ID: Ann Scott, female   DOB: [birth year], 58 y.o.   MRN: [id number]  CC: physical  HPI:  Patient is applying for a substitute teaching position and would like to have a physical form filled out.  Patient has a history of scoliosis which causes her back pain.  She wants documentation that states has scoliosis and that she cannot perform heavy lifting.  She states her hair is thin and dry and has been coming out for a few years.  She also reports that she has been fatigued for 5 years.    Allergies  Allergen Reactions  . Effexor [Venlafaxine] Anxiety, Palpitations and Other (See Comments)    Slurred speech  . Prozac [Fluoxetine] Itching and Anxiety  . Sulfa Antibiotics Itching and Rash   Past Medical History  Diagnosis Date  . Anxiety   . Depression    Current Outpatient Prescriptions on File Prior to Visit  Medication Sig Dispense Refill  . aspirin 81 MG chewable tablet Chew 81 mg by mouth daily.      . Multiple Vitamin (MULITIVITAMIN WITH MINERALS) TABS Take 1 tablet by mouth daily. Nature made      . ciprofloxacin (CIPRO) 500 MG tablet Take 1 tablet (500 mg total) by mouth 2 (two) times daily.  20 tablet  3   No current facility-administered medications on file prior to visit.   Family History  Problem Relation Age of Onset  . Heart disease Mother   . Cancer Father    History   Social History  . Marital Status: Single    Spouse Name: N/A    Number of Children: N/A  . Years of Education: N/A   Occupational History  . Not on file.   Social History Main Topics  . Smoking status: Never Smoker   . Smokeless tobacco: Never Used  . Alcohol Use: No  . Drug Use: No  . Sexual Activity: No   Other Topics Concern  . Not on file   Social History Narrative  . No narrative on file    Review of Systems  Constitutional: Negative.   HENT: Negative.   Eyes: Negative.   Respiratory: Negative.   Cardiovascular: Negative.   Gastrointestinal: Negative.    Genitourinary: Negative.   Musculoskeletal: Positive for back pain.  Skin: Negative.   Neurological: Negative.   Endo/Heme/Allergies: Negative for environmental allergies and polydipsia. Does not bruise/bleed easily.       Change in hair texture   Psychiatric/Behavioral: Negative.       Objective:   Filed Vitals:   06/18/13 1554  BP: 119/67  Pulse: 71  Temp: 97.9 F (36.6 C)  Resp: 12    Physical Exam: Constitutional: Patient appears well-developed and well-nourished. No distress. HENT: Normocephalic, atraumatic, External right and left ear normal. Oropharynx is clear and moist.  Eyes: Conjunctivae and EOM are normal. PERRLA, no scleral icterus. Neck: Normal ROM. Neck supple. No JVD. No tracheal deviation. No thyromegaly. CVS: RRR, S1/S2 +, no murmurs, no gallops, no carotid bruit.  Pulmonary: Effort and breath sounds normal, no stridor, rhonchi, wheezes, rales.  Abdominal: Soft. BS +,  no distension, tenderness, rebound or guarding.  Musculoskeletal: Normal range of motion. No edema and no tenderness. Severe scoliosis noted.   Lymphadenopathy: No lymphadenopathy noted, cervical, inguinal or axillary Neuro: Alert. Normal reflexes, muscle tone coordination. No cranial nerve deficit. Skin: Skin is warm and dry. No rash noted. Not diaphoretic. No erythema. No pallor. Psychiatric: Normal mood and affect. Behavior, judgment, thought  content normal.  Lab Results  Component Value Date   WBC 7.2 07/11/2012   HGB 13.2 07/11/2012   HCT 37.8 07/11/2012   MCV 84.9 07/11/2012   PLT 330 07/11/2012   Lab Results  Component Value Date   CREATININE 1.20* 07/11/2012   BUN 21 07/11/2012   NA 139 07/11/2012   K 4.4 07/11/2012   CL 104 07/11/2012   CO2 24 07/11/2012    Lab Results  Component Value Date   HGBA1C 5.4 07/11/2012   Lipid Panel     Component Value Date/Time   CHOL 172 07/11/2012 1257   TRIG 66 07/11/2012 1257   HDL 54 07/11/2012 1257   CHOLHDL 3.2 07/11/2012 1257   VLDL 13 07/11/2012 1257    LDLCALC 105* 07/11/2012 1257       Assessment and plan:   Ann Scott was seen today for scoliosis and back pain.  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   Hair loss       Ambrose FinlandValerie A Keck, NP-C [facility] [phone] 06/19/2013, 10:40 AM

## 2013-06-18 NOTE — Progress Notes (Signed)
Patient is here for documentation related to scoliosis and potential jobs. Also, requesting health certificate filled out for school system.

## 2013-06-19 LAB — TSH: TSH: 2.163 u[IU]/mL (ref 0.350–4.500)

## 2013-06-20 ENCOUNTER — Telehealth: Payer: Self-pay | Admitting: *Deleted

## 2013-06-20 ENCOUNTER — Encounter: Payer: Self-pay | Admitting: *Deleted

## 2013-06-20 NOTE — Telephone Encounter (Signed)
Message copied by Raynelle Chary on Thu Jun 20, 2013  3:07 PM ------      Message from: [place] A      Created: Wed Jun 19, 2013 11:09 AM       Let patient know her thyroid levels are normal ------

## 2013-06-20 NOTE — Progress Notes (Signed)
Pt came in today and I told her that her tyroid levels were normal.

## 2013-06-20 NOTE — Telephone Encounter (Signed)
Left message to return my call.  

## 2013-06-24 ENCOUNTER — Telehealth: Payer: Self-pay | Admitting: Internal Medicine

## 2013-06-24 ENCOUNTER — Ambulatory Visit: Payer: No Typology Code available for payment source | Attending: Internal Medicine

## 2013-06-24 DIAGNOSIS — Z0000 Encounter for general adult medical examination without abnormal findings: Secondary | ICD-10-CM

## 2013-06-24 NOTE — Telephone Encounter (Signed)
Pt is experiencing bleeding gums and says she needs a dental referral. Pt's last office visit was on 06/18/13, pls f/u with pt.

## 2013-06-24 NOTE — Patient Instructions (Signed)
Return Wednesday for results

## 2013-06-24 NOTE — Progress Notes (Signed)
Pt came in for a TB test only. successful wheel.

## 2013-06-25 ENCOUNTER — Encounter: Payer: Self-pay | Admitting: Emergency Medicine

## 2013-06-25 ENCOUNTER — Telehealth: Payer: Self-pay | Admitting: Emergency Medicine

## 2013-06-25 DIAGNOSIS — K068 Other specified disorders of gingiva and edentulous alveolar ridge: Secondary | ICD-10-CM

## 2013-06-25 NOTE — Telephone Encounter (Signed)
Yes, make sure patient is exercising goof dental hygiene such as flossing daily and brushing after every mea. You may also send give patient list of low cost dentist.

## 2013-06-25 NOTE — Telephone Encounter (Signed)
Left message for pt to call in regards to dental referral Dental referral placed

## 2013-06-25 NOTE — Telephone Encounter (Signed)
Is it ok to refer pt to dental due to bleeding gums? Please f/u

## 2013-06-26 ENCOUNTER — Ambulatory Visit: Payer: No Typology Code available for payment source | Attending: Internal Medicine

## 2013-06-26 ENCOUNTER — Encounter: Payer: Self-pay | Admitting: *Deleted

## 2013-06-26 DIAGNOSIS — Z0000 Encounter for general adult medical examination without abnormal findings: Secondary | ICD-10-CM

## 2013-06-26 LAB — TB SKIN TEST
INDURATION: 0 mm
TB Skin Test: NEGATIVE

## 2013-09-25 ENCOUNTER — Ambulatory Visit: Payer: No Typology Code available for payment source | Attending: Internal Medicine

## 2013-10-02 ENCOUNTER — Ambulatory Visit: Payer: Self-pay | Admitting: Internal Medicine

## 2013-10-17 ENCOUNTER — Telehealth: Payer: Self-pay | Admitting: Emergency Medicine

## 2013-10-17 ENCOUNTER — Telehealth: Payer: Self-pay | Admitting: Internal Medicine

## 2013-10-17 ENCOUNTER — Other Ambulatory Visit: Payer: Self-pay | Admitting: Emergency Medicine

## 2013-10-17 DIAGNOSIS — M546 Pain in thoracic spine: Secondary | ICD-10-CM

## 2013-10-17 NOTE — Telephone Encounter (Signed)
Please f/u    Pt is needing to make an appointment with a back specialist who is in the [employer] for purposes of disability. Her court hearing is nov 13th but the lawyer needs the paperwork at least 2 weeks or so ahead of time to process everything accordingly. She mentioned that she already has been diagnosed with scoliosis and was hoping to get this referral with out making an appt to save time so that she can get her paperwork in on time. Please follow up with pt.

## 2013-10-17 NOTE — Telephone Encounter (Signed)
Pt is needing to make an appointment with a back specialist who is in the [employer] for purposes of disability. Her court hearing is nov 13th but the lawyer needs the paperwork at least 2 weeks or so ahead of time to process everything accordingly. She mentioned that she already has been diagnosed with scoliosis and was hoping to get this referral with out making an appt to save time so that she can get her paperwork in on time. Please follow up with pt.

## 2013-10-17 NOTE — Telephone Encounter (Signed)
You may send referral to sports medicine but I believe they have stopped taking our patients

## 2013-10-17 NOTE — Telephone Encounter (Signed)
Left message for pt that we placed referral for Sports Medicine

## 2013-11-03 ENCOUNTER — Emergency Department (HOSPITAL_COMMUNITY)
Admission: EM | Admit: 2013-11-03 | Discharge: 2013-11-03 | Disposition: A | Discharge status: Home / Self Care | Payer: No Typology Code available for payment source | Attending: Emergency Medicine | Admitting: Emergency Medicine

## 2013-11-03 ENCOUNTER — Encounter (HOSPITAL_COMMUNITY): Payer: Self-pay | Admitting: Emergency Medicine

## 2013-11-03 DIAGNOSIS — Z79899 Other long term (current) drug therapy: Secondary | ICD-10-CM | POA: Insufficient documentation

## 2013-11-03 DIAGNOSIS — R200 Anesthesia of skin: Secondary | ICD-10-CM | POA: Insufficient documentation

## 2013-11-03 DIAGNOSIS — Z7982 Long term (current) use of aspirin: Secondary | ICD-10-CM | POA: Insufficient documentation

## 2013-11-03 DIAGNOSIS — M545 Low back pain: Secondary | ICD-10-CM | POA: Insufficient documentation

## 2013-11-03 DIAGNOSIS — G8929 Other chronic pain: Secondary | ICD-10-CM

## 2013-11-03 DIAGNOSIS — M549 Dorsalgia, unspecified: Secondary | ICD-10-CM

## 2013-11-03 DIAGNOSIS — Z8659 Personal history of other mental and behavioral disorders: Secondary | ICD-10-CM | POA: Insufficient documentation

## 2013-11-03 MED ORDER — KETOROLAC TROMETHAMINE 60 MG/2ML IM SOLN
60.0000 mg | Freq: Once | INTRAMUSCULAR | Status: AC
  Administered 2013-11-03: 60 mg via INTRAMUSCULAR
  Filled 2013-11-03: qty 2

## 2013-11-03 NOTE — ED Notes (Addendum)
Pt c/o generalized back pain and intermittent BLE numbness and tingling x 4 days, SOB w/ strenuous activity x "years", and increasing depression x "years."  Pain score 4/10, increasing w/ activity.  Pt reports that she did not take any pain medication today.  Hx of scoliosis.  Pt sts "my life has just kinda fell apart over the last couple weeks."  Denies SI/HI/Hallucinations.

## 2013-11-03 NOTE — Discharge Instructions (Signed)

## 2013-11-03 NOTE — ED Provider Notes (Signed)
CSN: [id number]     Arrival date & time 11/03/13  1202 History   First MD Initiated Contact with Patient 11/03/13 1352     Chief Complaint  Patient presents with  . Back Pain  . Leg numbness      (Consider location/radiation/quality/duration/timing/severity/associated sxs/prior Treatment) HPI Comments: Patient here complaining of worsening chronic back pain secondary to scoliosis. Denies any new injuries at this time. Pain is localized to her lower back and characterized as dull and worse with certain positions. They are worse with walking and better with rest. Denies any bowel or bladder dysfunction. No saddle anesthesias. Some numbness intermittently down her legs. Symptoms persisted and she has been using Tylenol and aspirin without relief.  Patient is a 58 y.o. female presenting with back pain. The history is provided by the patient.  Back Pain   Past Medical History  Diagnosis Date  . Anxiety   . Depression    Past Surgical History  Procedure Laterality Date  . Endometrial ablation     Family History  Problem Relation Age of Onset  . Heart disease Mother   . Cancer Father    History  Substance Use Topics  . Smoking status: Never Smoker   . Smokeless tobacco: Never Used  . Alcohol Use: No   OB History   Grav Para Term Preterm Abortions TAB SAB Ect Mult Living                 Review of Systems  Musculoskeletal: Positive for back pain.  All other systems reviewed and are negative.     Allergies  Effexor; Prozac; and Sulfa antibiotics  Home Medications   Prior to Admission medications   Medication Sig Start Date End Date Taking? Authorizing Provider  aspirin EC 81 MG tablet Take 81 mg by mouth 3 (three) times daily as needed for mild pain.    Yes Historical Provider, MD  Multiple Vitamin (MULITIVITAMIN WITH MINERALS) TABS Take 1 tablet by mouth daily.    Yes Historical Provider, MD  vitamin C (ASCORBIC ACID) 500 MG tablet Take 500 mg by mouth daily. 03/18/13   Yes Historical Provider, MD   BP 127/74  Pulse 65  Temp(Src) 98.1 F (36.7 C) (Oral)  Resp 18  SpO2 99% Physical Exam  Nursing note and vitals reviewed. Constitutional: She is oriented to person, place, and time. She appears well-developed and well-nourished.  Non-toxic appearance. No distress.  HENT:  Head: Normocephalic and atraumatic.  Eyes: Conjunctivae, EOM and lids are normal. Pupils are equal, round, and reactive to light.  Neck: Normal range of motion. Neck supple. No tracheal deviation present. No mass present.  Cardiovascular: Normal rate, regular rhythm and normal heart sounds.  Exam reveals no gallop.   No murmur heard. Pulmonary/Chest: Effort normal and breath sounds normal. No stridor. No respiratory distress. She has no decreased breath sounds. She has no wheezes. She has no rhonchi. She has no rales.  Abdominal: Soft. Normal appearance and bowel sounds are normal. She exhibits no distension. There is no tenderness. There is no rebound and no CVA tenderness.  Musculoskeletal: Normal range of motion. She exhibits no edema and no tenderness.       Back:  Neurological: She is alert and oriented to person, place, and time. She has normal strength. No cranial nerve deficit or sensory deficit. GCS eye subscore is 4. GCS verbal subscore is 5. GCS motor subscore is 6.  Skin: Skin is warm and dry. No abrasion and no rash  noted.  Psychiatric: She has a normal mood and affect. Her speech is normal and behavior is normal.    ED Course  Procedures (including critical care time) Labs Review Labs Reviewed - No data to display  Imaging Review No results found.   EKG Interpretation None      MDM   Final diagnoses:  None    Patient ambulatory here in the department without signs of foot drop. Pain is chronic in nature. Patient given dose of Toradol IM here and will be [facility].no concern for cauda equina or acute neurological  process    Toy BakerAnthony T Meggie Laseter, MD 11/03/13 [phone]

## 2013-11-03 NOTE — ED Notes (Signed)
Bed: WA04 Expected date:  Expected time:  Means of arrival:  Comments: Triage 4 

## 2013-11-06 ENCOUNTER — Telehealth: Payer: Self-pay | Admitting: Internal Medicine

## 2013-11-15 ENCOUNTER — Telehealth: Payer: Self-pay | Admitting: Internal Medicine

## 2013-11-15 ENCOUNTER — Ambulatory Visit: Payer: No Typology Code available for payment source | Attending: Internal Medicine | Admitting: Internal Medicine

## 2013-11-15 ENCOUNTER — Encounter: Payer: Self-pay | Admitting: Internal Medicine

## 2013-11-15 ENCOUNTER — Ambulatory Visit (HOSPITAL_COMMUNITY)
Admission: RE | Admit: 2013-11-15 | Discharge: 2013-11-15 | Disposition: A | Discharge status: Home / Self Care | Payer: No Typology Code available for payment source | Source: Ambulatory Visit | Attending: Internal Medicine | Admitting: Internal Medicine

## 2013-11-15 VITALS — BP 129/79 | HR 77 | Temp 97.5°F | Resp 16 | Wt 130.0 lb

## 2013-11-15 DIAGNOSIS — Z7982 Long term (current) use of aspirin: Secondary | ICD-10-CM | POA: Insufficient documentation

## 2013-11-15 DIAGNOSIS — M5489 Other dorsalgia: Secondary | ICD-10-CM

## 2013-11-15 DIAGNOSIS — Z2821 Immunization not carried out because of patient refusal: Secondary | ICD-10-CM

## 2013-11-15 DIAGNOSIS — M419 Scoliosis, unspecified: Secondary | ICD-10-CM

## 2013-11-15 DIAGNOSIS — Z8249 Family history of ischemic heart disease and other diseases of the circulatory system: Secondary | ICD-10-CM | POA: Insufficient documentation

## 2013-11-15 DIAGNOSIS — Z809 Family history of malignant neoplasm, unspecified: Secondary | ICD-10-CM | POA: Insufficient documentation

## 2013-11-15 MED ORDER — IBUPROFEN 600 MG PO TABS: 600.0000 mg | ORAL_TABLET | Freq: Three times a day (TID) | ORAL | Status: DC | PRN

## 2013-11-15 NOTE — Telephone Encounter (Signed)
Patient just had her x-rays done and is hoping to get a paper copy of these by today or latest by Monday. Please follow up with pt.

## 2013-11-15 NOTE — Progress Notes (Signed)
Patient here for follow up on her back  Patient is requesting a x ray on her back

## 2013-11-15 NOTE — Patient Instructions (Signed)
Scoliosis  Scoliosis is the name given to a spine that curves sideways. Scoliosis can cause twisting of your shoulders, hips, chest, back, and rib cage.   CAUSES   The cause of scoliosis is not always known. It may be caused by a birth defect or by a disease that can cause muscular dysfunction and imbalance, such as cerebral palsy and muscular dystrophy.   RISK FACTORS  Having a disease that causes muscle disease or dysfunction.  SIGNS AND SYMPTOMS  Scoliosis often has no signs or symptoms. If they are present, they may include:  · Unequal size of one body side compared to the other (asymmetry).  · Visible curvature of the spine.  · Pain. The pain may limit physical activity.  · Shortness of breath.  · Bowel or bladder issues.  DIAGNOSIS  A skilled health care provider will perform an evaluation. This will involve:  · Taking your history.  · Performing a physical examination.  · Performing a neurological exam to detect nerve or muscle function loss.  · Range of motion studies on the spine.  · X-rays.  An MRI may also be obtained.  TREATMENT   Treatment varies depending on the nature, extent, and severity of the disease. If the curvature is not great, you may need only observation. A brace may be used to prevent scoliosis from progressing. A brace may also be needed during growth spurts. Physical therapy may be of benefit. Surgery may be required.   HOME CARE INSTRUCTIONS   · Your health care provider may suggest exercises to strengthen your muscles. Perform them as directed.  · Ask your health care provider before participating in any sports.    · If you have been prescribed an orthopedic brace, wear it as instructed by your health care provider.   SEEK MEDICAL CARE IF:  Your brace causes the skin to become sore (chafe) or is uncomfortable.   SEEK IMMEDIATE MEDICAL CARE IF:  · You have back pain that is not relieved by the medicines prescribed by your health care provider.    · Your legs feel weak or you lose  function in your legs.  · You lose some bowel or bladder control.    Document Released: 12/25/1999 Document Revised: 01/01/2013 Document Reviewed: 09/03/2012  ExitCare® Patient Information ©2015 [employer]. This information is not intended to replace advice given to you by your health care provider. Make sure you discuss any questions you have with your health care provider.

## 2013-11-15 NOTE — Progress Notes (Signed)
Patient ID: Ann Scott, female   DOB: Nov 26, 1955, 58 y.o.   MRN: [id number]  CC:  Back pain  HPI:  Patient presents to clinic today with chest pain.  Patient bought records from 2004 when she went to visit a orthopedic surgeon.  She was at that time unsymptomatic of back pain and was diagnosed with scoliosis.  She did not require any treatment at that time.  It was recommended at that time to have a yearly xray for evaluation. Xray at that time revealed a 38 degree thoracolumbar curve measured from T11 to L4.  She is requesting a xray because she has been having more difficulty with keep a job due to strenous activity and increased pain.  She denies bowel or bladder dysfunction.  The back pain is now causing numbness and weakness of bilateral legs.  She is unable to describe the pain because she states different parts of her back hurt that cause different pains but she thinks a rib is pressing on her spine. She denies bowel or bladder dysfunction.     Allergies  Allergen Reactions  . Effexor [Venlafaxine] Anxiety, Palpitations and Other (See Comments)    Slurred speech  . Prozac [Fluoxetine] Itching and Anxiety  . Sulfa Antibiotics Itching and Rash   Past Medical History  Diagnosis Date  . Anxiety   . Depression    Current Outpatient Prescriptions on File Prior to Visit  Medication Sig Dispense Refill  . aspirin EC 81 MG tablet Take 81 mg by mouth 3 (three) times daily as needed for mild pain.     . Multiple Vitamin (MULITIVITAMIN WITH MINERALS) TABS Take 1 tablet by mouth daily.     . vitamin C (ASCORBIC ACID) 500 MG tablet Take 500 mg by mouth daily.     No current facility-administered medications on file prior to visit.   Family History  Problem Relation Age of Onset  . Heart disease Mother   . Cancer Father    History   Social History  . Marital Status: Single    Spouse Name: N/A    Number of Children: N/A  . Years of Education: N/A   Occupational History  . Not on  file.   Social History Main Topics  . Smoking status: Never Smoker   . Smokeless tobacco: Never Used  . Alcohol Use: No  . Drug Use: No  . Sexual Activity: No   Other Topics Concern  . Not on file   Social History Narrative    Review of Systems: Constitutional: Negative for fever, chills, diaphoresis, activity change, appetite change and fatigue. HENT: Negative for ear pain, nosebleeds, congestion, facial swelling, rhinorrhea, neck pain, neck stiffness and ear discharge.  Eyes: Negative for pain, discharge, redness, itching and visual disturbance. Respiratory: Negative for cough, choking, chest tightness, shortness of breath, wheezing and stridor.  Cardiovascular: Negative for chest pain, palpitations and leg swelling. Gastrointestinal: Negative for abdominal distention. Genitourinary: Negative for dysuria, urgency, frequency, hematuria, flank pain, decreased urine volume, difficulty urinating and dyspareunia.  Musculoskeletal: Negative for joint swelling, arthralgias and gait problem. + back pain  Neurological: Negative for dizziness, tremors, seizures, syncope, facial asymmetry, speech difficulty, weakness, light-headedness, numbness and headaches.  Hematological: Negative for adenopathy. Does not bruise/bleed easily. Psychiatric/Behavioral: Negative for hallucinations, behavioral problems, confusion, dysphoric mood, decreased concentration and agitation.    Objective:   Filed Vitals:   11/15/13 1431  BP: 129/79  Pulse: 77  Temp: 97.5 F (36.4 C)  Resp: 16  Physical Exam  Constitutional: She is oriented to person, place, and time.  Neck: Normal range of motion.  Cardiovascular: Normal rate, regular rhythm and normal heart sounds.   No murmur heard. Pulmonary/Chest: Effort normal and breath sounds normal. She has no wheezes.  Abdominal: Soft. Bowel sounds are normal. She exhibits no distension.  Musculoskeletal: She exhibits no tenderness (no spinal tenderness).   Scoliosis  Paraspinal muscle protrusion  Neurological: She is alert and oriented to person, place, and time.  Skin: Skin is warm and dry.   .  Lab Results  Component Value Date   WBC 7.2 07/11/2012   HGB 13.2 07/11/2012   HCT 37.8 07/11/2012   MCV 84.9 07/11/2012   PLT 330 07/11/2012   Lab Results  Component Value Date   CREATININE 1.20* 07/11/2012   BUN 21 07/11/2012   NA 139 07/11/2012   K 4.4 07/11/2012   CL 104 07/11/2012   CO2 24 07/11/2012    Lab Results  Component Value Date   HGBA1C 5.4 07/11/2012   Lipid Panel     Component Value Date/Time   CHOL 172 07/11/2012 1257   TRIG 66 07/11/2012 1257   HDL 54 07/11/2012 1257   CHOLHDL 3.2 07/11/2012 1257   VLDL 13 07/11/2012 1257   LDLCALC 105* 07/11/2012 1257       Assessment and plan:   Ann Scott was seen today for back pain.  Diagnoses and associated orders for this visit:  Scoliosis  Midline back pain, unspecified location - DG Spine Entire Ap & Lat; Future - ibuprofen (ADVIL,MOTRIN) 600 MG tablet; Take 1 tablet (600 mg total) by mouth every 8 (eight) hours as needed. Patient given exercises to complete at home. Help of heating pad may help with pain  Refused influenza vaccine Explained that annual influenza is recommended per CDC guidelines and is highly suggested to anyone who has has CHF, COPD, DM or immunocompromised. Benefits of influenza described in detail.   Return if symptoms worsen or fail to improve.       Ann Scott, Ann Spilde, NP-C [facility] [phone] 11/19/2013, 2:54 PM

## 2013-11-19 ENCOUNTER — Telehealth: Payer: Self-pay | Admitting: Emergency Medicine

## 2013-11-19 NOTE — Telephone Encounter (Signed)
Left message for pt that xray results print out will be at front desk

## 2013-11-27 ENCOUNTER — Encounter (HOSPITAL_COMMUNITY): Payer: Self-pay | Admitting: Emergency Medicine

## 2013-11-27 ENCOUNTER — Emergency Department (HOSPITAL_COMMUNITY)
Admission: EM | Admit: 2013-11-27 | Discharge: 2013-11-27 | Disposition: A | Discharge status: Home / Self Care | Payer: Self-pay | Attending: Emergency Medicine | Admitting: Emergency Medicine

## 2013-11-27 DIAGNOSIS — Z8659 Personal history of other mental and behavioral disorders: Secondary | ICD-10-CM | POA: Insufficient documentation

## 2013-11-27 DIAGNOSIS — Z79899 Other long term (current) drug therapy: Secondary | ICD-10-CM | POA: Insufficient documentation

## 2013-11-27 DIAGNOSIS — Z7982 Long term (current) use of aspirin: Secondary | ICD-10-CM | POA: Insufficient documentation

## 2013-11-27 DIAGNOSIS — Y92129 Unspecified place in nursing home as the place of occurrence of the external cause: Secondary | ICD-10-CM | POA: Insufficient documentation

## 2013-11-27 DIAGNOSIS — S30860A Insect bite (nonvenomous) of lower back and pelvis, initial encounter: Secondary | ICD-10-CM | POA: Insufficient documentation

## 2013-11-27 DIAGNOSIS — S50862A Insect bite (nonvenomous) of left forearm, initial encounter: Secondary | ICD-10-CM | POA: Insufficient documentation

## 2013-11-27 DIAGNOSIS — Y9389 Activity, other specified: Secondary | ICD-10-CM | POA: Insufficient documentation

## 2013-11-27 DIAGNOSIS — W57XXXA Bitten or stung by nonvenomous insect and other nonvenomous arthropods, initial encounter: Secondary | ICD-10-CM | POA: Insufficient documentation

## 2013-11-27 DIAGNOSIS — Y998 Other external cause status: Secondary | ICD-10-CM | POA: Insufficient documentation

## 2013-11-27 MED ORDER — DIPHENHYDRAMINE HCL 25 MG PO TABS: 25.0000 mg | ORAL_TABLET | Freq: Four times a day (QID) | ORAL | Status: DC

## 2013-11-27 MED ORDER — CEPHALEXIN 500 MG PO CAPS: 1000.0000 mg | ORAL_CAPSULE | Freq: Two times a day (BID) | ORAL | Status: DC

## 2013-11-27 NOTE — ED Notes (Signed)
Site on arm and back marked to measure if redness spreads.

## 2013-11-27 NOTE — ED Notes (Signed)
Pt here with insect bites to left arm and back; pt sts increased stress since mother passed away last week

## 2013-11-27 NOTE — Discharge Instructions (Signed)
You have localized reaction to recent insect bite with potential for skin infection.  Take antibiotic as prescribed.  Follow up with your doctor in 48 hrs for wound recheck.  Return if your condition worsen.  Insect Bite Mosquitoes, flies, fleas, bedbugs, and many other insects can bite. Insect bites are different from insect stings. A sting is when venom is injected into the skin. Some insect bites can transmit infectious diseases. SYMPTOMS  Insect bites usually turn red, swell, and itch for 2 to 4 days. They often go away on their own. TREATMENT  Your caregiver may prescribe antibiotic medicines if a bacterial infection develops in the bite. HOME CARE INSTRUCTIONS  Do not scratch the bite area.  Keep the bite area clean and dry. Wash the bite area thoroughly with soap and water.  Put ice or cool compresses on the bite area.  Put ice in a plastic bag.  Place a towel between your skin and the bag.  Leave the ice on for 20 minutes, 4 times a day for the first 2 to 3 days, or as directed.  You may apply a baking soda paste, cortisone cream, or calamine lotion to the bite area as directed by your caregiver. This can help reduce itching and swelling.  Only take over-the-counter or prescription medicines as directed by your caregiver.  If you are given antibiotics, take them as directed. Finish them even if you start to feel better. You may need a tetanus shot if:  You cannot remember when you had your last tetanus shot.  You have never had a tetanus shot.  The injury broke your skin. If you get a tetanus shot, your arm may swell, get red, and feel warm to the touch. This is common and not a problem. If you need a tetanus shot and you choose not to have one, there is a rare chance of getting tetanus. Sickness from tetanus can be serious. SEEK IMMEDIATE MEDICAL CARE IF:   You have increased pain, redness, or swelling in the bite area.  You see a red line on the skin coming from the  bite.  You have a fever.  You have joint pain.  You have a headache or neck pain.  You have unusual weakness.  You have a rash.  You have chest pain or shortness of breath.  You have abdominal pain, nausea, or vomiting.  You feel unusually tired or sleepy. MAKE SURE YOU:   Understand these instructions.  Will watch your condition.  Will get help right away if you are not doing well or get worse. Document Released: 02/04/2004 Document Revised: 03/21/2011 Document Reviewed: 07/28/2010 [facility] Patient Information 2015 [place], [place]. This information is not intended to replace advice given to you by your health care provider. Make sure you discuss any questions you have with your health care provider.

## 2013-11-27 NOTE — ED Provider Notes (Signed)
CSN: [id number]     Arrival date & time 11/27/13  1402 History  This chart was scribed for non-physician practitioner, Fayrene HelperBowie Djuana Littleton, PA-C working with Glynn OctaveStephen Rancour, MD by Greggory StallionKayla Andersen, ED scribe. This patient was seen in room TR06C/TR06C and the patient's care was started at 2:43 PM.   Chief Complaint  Patient presents with  . Insect Bite   The history is provided by the patient. No language interpreter was used.    HPI Comments: Ann Scott is a 58 y.o. female who presents to the Emergency Department complaining of insect bites to her left arm and back that started one week ago. [facility] what she thought were mosquitoes. Reports itching and aching pain. Denies new soaps, detergents, medications, or pets. Pt has taken aspirin to help with pain with some relief. Denies fever, chills, trouble breathing.   Past Medical History  Diagnosis Date  . Anxiety   . Depression    Past Surgical History  Procedure Laterality Date  . Endometrial ablation     Family History  Problem Relation Age of Onset  . Heart disease Mother   . Cancer Father    History  Substance Use Topics  . Smoking status: Never Smoker   . Smokeless tobacco: Never Used  . Alcohol Use: No   OB History    No data available     Review of Systems  Constitutional: Negative for fever and chills.  Musculoskeletal: Positive for myalgias.  Skin: Positive for rash.  All other systems reviewed and are negative.  Allergies  Effexor; Prozac; and Sulfa antibiotics  Home Medications   Prior to Admission medications   Medication Sig Start Date End Date Taking? Authorizing Provider  aspirin EC 81 MG tablet Take 81 mg by mouth 3 (three) times daily as needed for mild pain.     Historical Provider, MD  ibuprofen (ADVIL,MOTRIN) 600 MG tablet Take 1 tablet (600 mg total) by mouth every 8 (eight) hours as needed. 11/15/13   Ambrose FinlandValerie A Keck, NP  Multiple Vitamin (MULITIVITAMIN WITH  MINERALS) TABS Take 1 tablet by mouth daily.     Historical Provider, MD  vitamin C (ASCORBIC ACID) 500 MG tablet Take 500 mg by mouth daily. 03/18/13   Historical Provider, MD   BP 146/73 mmHg  Pulse 70  Temp(Src) 97.9 F (36.6 C) (Oral)  Resp 18  Ht 5\' 3"  (1.6 m)  Wt 130 lb (58.968 kg)  BMI 23.03 kg/m2  SpO2 98%   Physical Exam  Constitutional: She is oriented to person, place, and time. She appears well-developed and well-nourished. No distress.  HENT:  Head: Normocephalic and atraumatic.  Eyes: Conjunctivae and EOM are normal.  Neck: Neck supple. No tracheal deviation present.  Cardiovascular: Normal rate.   Pulmonary/Chest: Effort normal. No respiratory distress.  Musculoskeletal: Normal range of motion.  Radial pulse palpable. Normal grip strength with brisk capillary refill throughout.   Neurological: She is alert and oriented to person, place, and time.  Skin: Skin is warm and dry.  Pruritic hives noted to left lower back measuring 4 x 6 cm. Warm to touch. Blanchable and erythematous. Multiple pruritic, urticarial rash noted to left forearm with surrounding erythema and induration. Warmth and tenderness to palpation.   Psychiatric: She has a normal mood and affect. Her behavior is normal.  Nursing note and vitals reviewed.   ED Course  Procedures (including critical care time)  DIAGNOSTIC STUDIES: Oxygen Saturation is 98% on  RA, normal by my interpretation.    COORDINATION OF CARE: 2:47 PM-Discussed treatment plan which includes an antibiotic and benadryl with pt at bedside and pt agreed to plan. Advised pt [facility]   Suspect localized reaction to insect bite with potential development of cellulitis, no abscess.  Skin margin were marked and dated.  Return in 48 hrs for wound recheck.  Keflex prescribed. Benadryl prescribed  Labs Review Labs Reviewed - No data to display  Imaging Review No results  found.   EKG Interpretation None      MDM   Final diagnoses:  Insect bite of left forearm with local reaction, initial encounter   BP 146/73 mmHg  Pulse 70  Temp(Src) 97.9 F (36.6 C) (Oral)  Resp 18  Ht 5\' 3"  (1.6 m)  Wt 130 lb (58.968 kg)  BMI 23.03 kg/m2  SpO2 98%   I personally performed the services described in this documentation, which was scribed in my presence. The recorded information has been reviewed and is accurate.  Fayrene HelperBowie Leonora Gores, PA-C 11/27/13 1500  Glynn OctaveStephen Rancour, MD 11/27/13 [phone]

## 2014-01-18 ENCOUNTER — Emergency Department (HOSPITAL_COMMUNITY)
Admission: EM | Admit: 2014-01-18 | Discharge: 2014-01-18 | Disposition: A | Discharge status: Home / Self Care | Payer: Self-pay | Attending: Emergency Medicine | Admitting: Emergency Medicine

## 2014-01-18 ENCOUNTER — Encounter (HOSPITAL_COMMUNITY): Payer: Self-pay

## 2014-01-18 DIAGNOSIS — Z792 Long term (current) use of antibiotics: Secondary | ICD-10-CM | POA: Insufficient documentation

## 2014-01-18 DIAGNOSIS — R42 Dizziness and giddiness: Secondary | ICD-10-CM | POA: Insufficient documentation

## 2014-01-18 DIAGNOSIS — Z7982 Long term (current) use of aspirin: Secondary | ICD-10-CM | POA: Insufficient documentation

## 2014-01-18 DIAGNOSIS — R110 Nausea: Secondary | ICD-10-CM | POA: Insufficient documentation

## 2014-01-18 MED ORDER — LORAZEPAM 0.5 MG PO TABS: 0.5000 mg | ORAL_TABLET | Freq: Four times a day (QID) | ORAL | Status: DC | PRN

## 2014-01-18 MED ORDER — LORAZEPAM 0.5 MG PO TABS
0.5000 mg | ORAL_TABLET | Freq: Once | ORAL | Status: AC
  Administered 2014-01-18: 0.5 mg via ORAL
  Filled 2014-01-18: qty 1

## 2014-01-18 MED ORDER — MECLIZINE HCL 25 MG PO TABS: 25.0000 mg | ORAL_TABLET | Freq: Three times a day (TID) | ORAL | Status: AC

## 2014-01-18 MED ORDER — MECLIZINE HCL 25 MG PO TABS
25.0000 mg | ORAL_TABLET | Freq: Once | ORAL | Status: AC
  Administered 2014-01-18: 25 mg via ORAL
  Filled 2014-01-18: qty 1

## 2014-01-18 NOTE — ED Provider Notes (Signed)
CSN: [id number]     Arrival date & time 01/18/14  1825 History   First MD Initiated Contact with Patient 01/18/14 1906     Chief Complaint  Patient presents with  . Dizziness    HPI  Patient presents with dizziness. Symptoms began gradually approximately 3 days ago, since onset has been more severe with each episode. Episodes are typically present when awakening in the morning, and with suddenly moving her head. There is mild associated nausea, but no vomiting, confusion, disorders, visual loss. Patient denies recent health issues, though states that she is under substantial life stress. Sx seem to improve w rest but recur when she moves again. No CP, HA, confusion / disorientation / visual changes / weakness anywhere.   Past Medical History  Diagnosis Date  . Anxiety   . Depression    Past Surgical History  Procedure Laterality Date  . Endometrial ablation     Family History  Problem Relation Age of Onset  . Heart disease Mother   . Cancer Father    History  Substance Use Topics  . Smoking status: Never Smoker   . Smokeless tobacco: Never Used  . Alcohol Use: No   OB History    No data available     Review of Systems  Constitutional:       Per HPI, otherwise negative  HENT:       Per HPI, otherwise negative  Respiratory:       Per HPI, otherwise negative  Cardiovascular:       Per HPI, otherwise negative  Gastrointestinal: Positive for nausea. Negative for vomiting.  Endocrine:       Negative aside from HPI  Genitourinary:       Neg aside from HPI   Musculoskeletal:       Per HPI, otherwise negative  Skin: Negative.   Allergic/Immunologic: Negative for immunocompromised state.  Neurological: Positive for dizziness. Negative for tremors, seizures, syncope, speech difficulty, weakness, light-headedness, numbness and headaches.      Allergies  Effexor; Prozac; and Sulfa antibiotics  Home Medications   Prior to Admission medications   Medication Sig  Start Date End Date Taking? Authorizing Provider  aspirin EC 81 MG tablet Take 81 mg by mouth 3 (three) times daily as needed for mild pain (pain).    Yes Historical Provider, MD  Multiple Vitamin (MULITIVITAMIN WITH MINERALS) TABS Take 1 tablet by mouth daily.    Yes Historical Provider, MD  Vortioxetine HBr (BRINTELLIX) 10 MG TABS Take 10 mg by mouth daily.   Yes Historical Provider, MD  cephALEXin (KEFLEX) 500 MG capsule Take 2 capsules (1,000 mg total) by mouth 2 (two) times daily. Patient not taking: Reported on 01/18/2014 11/27/13   Fayrene HelperBowie Tran, PA-C  diphenhydrAMINE (BENADRYL) 25 MG tablet Take 1 tablet (25 mg total) by mouth every 6 (six) hours. Patient not taking: Reported on 01/18/2014 11/27/13   Fayrene HelperBowie Tran, PA-C  ibuprofen (ADVIL,MOTRIN) 600 MG tablet Take 1 tablet (600 mg total) by mouth every 8 (eight) hours as needed. 11/15/13   Ambrose FinlandValerie A Keck, NP   BP 136/71 mmHg  Pulse 75  Temp(Src) 97.9 F (36.6 C)  Resp 16  SpO2 99% Physical Exam  Constitutional: She is oriented to person, place, and time. She appears well-developed and well-nourished. No distress.  HENT:  Head: Normocephalic and atraumatic.  Right Ear: Tympanic membrane normal.  Left Ear: Tympanic membrane normal.  Eyes: Conjunctivae and EOM are normal.  Cardiovascular: Normal rate and regular rhythm.  Pulmonary/Chest: Effort normal and breath sounds normal. No stridor. No respiratory distress.  Abdominal: She exhibits no distension.  Musculoskeletal: She exhibits no edema.  Neurological: She is alert and oriented to person, place, and time. She displays no atrophy and no tremor. No cranial nerve deficit or sensory deficit. She exhibits normal muscle tone. She displays no seizure activity. Coordination normal.  Dizziness provoked with head rotation in either direction.  Skin: Skin is warm and dry.  Psychiatric: Her mood appears anxious. Her speech is rapid and/or pressured. Thought content is not paranoid and not  delusional. Cognition and memory are not impaired.  Nursing note and vitals reviewed.   ED Course  Procedures (including critical care time) On repeat exam after receiving initial medication the patient is awake and alert, in no distress, with no new complaints.   MDM  Patient presents several days of vertigo-like symptoms. No evidence for neurologic deficiency, and symptoms are easily reproduced with head rotation. No imaging indicated. Patient improved here, was started on a course of medications and d/c in stable condition.    Gerhard Munch, MD 01/18/14 2022

## 2014-01-18 NOTE — Discharge Instructions (Signed)
As discussed, your evaluation today has been largely reassuring.  But, it is important that you monitor your condition carefully, and do not hesitate to return to the ED if you develop new, or concerning changes in your condition.  Your dizziness is likely due to vertigo.  Otherwise, please follow-up with your physician for appropriate ongoing care.

## 2014-01-18 NOTE — ED Notes (Signed)
She reports gradually worsening "dizziness" x 3-4 days.  She states today she experienced h/a with nausea, but denies vomiting.

## 2014-05-09 ENCOUNTER — Encounter (HOSPITAL_COMMUNITY): Payer: Self-pay | Admitting: Emergency Medicine

## 2014-05-09 ENCOUNTER — Emergency Department (INDEPENDENT_AMBULATORY_CARE_PROVIDER_SITE_OTHER)
Admission: EM | Admit: 2014-05-09 | Discharge: 2014-05-09 | Disposition: A | Discharge status: Home / Self Care | Payer: Self-pay | Source: Home / Self Care | Attending: Family Medicine | Admitting: Family Medicine

## 2014-05-09 DIAGNOSIS — R300 Dysuria: Secondary | ICD-10-CM

## 2014-05-09 DIAGNOSIS — R350 Frequency of micturition: Secondary | ICD-10-CM

## 2014-05-09 DIAGNOSIS — R3915 Urgency of urination: Secondary | ICD-10-CM

## 2014-05-09 LAB — POCT URINALYSIS DIP (DEVICE)
BILIRUBIN URINE: NEGATIVE
GLUCOSE, UA: NEGATIVE mg/dL
HGB URINE DIPSTICK: NEGATIVE
Ketones, ur: NEGATIVE mg/dL
Leukocytes, UA: NEGATIVE
NITRITE: NEGATIVE
PH: 7 (ref 5.0–8.0)
PROTEIN: NEGATIVE mg/dL
Specific Gravity, Urine: 1.015 (ref 1.005–1.030)
UROBILINOGEN UA: 0.2 mg/dL (ref 0.0–1.0)

## 2014-05-09 MED ORDER — CEPHALEXIN 250 MG PO CAPS: 250.0000 mg | ORAL_CAPSULE | Freq: Four times a day (QID) | ORAL | Status: DC

## 2014-05-09 NOTE — Discharge Instructions (Signed)
Urinary Frequency Also take AZO for urinary symptoms The number of times a normal person urinates depends upon how much liquid they take in and how much liquid they are losing. If the temperature is hot and there is high humidity, then the person will sweat more and usually breathe a little more frequently. These factors decrease the amount of frequency of urination that would be considered normal. The amount you drink is easily determined, but the amount of fluid lost is sometimes more difficult to calculate.  Fluid is lost in two ways:  Sensible fluid loss is usually measured by the amount of urine that you get rid of. Losses of fluid can also occur with diarrhea.  Insensible fluid loss is more difficult to measure. It is caused by evaporation. Insensible loss of fluid occurs through breathing and sweating. It usually ranges from a little less than a quart to a little more than a quart of fluid a day. In normal temperatures and activity levels, the average person may urinate 4 to 7 times in a 24-hour period. Needing to urinate more often than that could indicate a problem. If one urinates 4 to 7 times in 24 hours and has large volumes each time, that could indicate a different problem from one who urinates 4 to 7 times a day and has small volumes. The time of urinating is also important. Most urinating should be done during the waking hours. Getting up at night to urinate frequently can indicate some problems. CAUSES  The bladder is the organ in your lower abdomen that holds urine. Like a balloon, it swells some as it fills up. Your nerves sense this and tell you it is time to head for the bathroom. There are a number of reasons that you might feel the need to urinate more often than usual. They include:  Urinary tract infection. This is usually associated with other signs such as burning when you urinate.  In men, problems with the prostate (a walnut-size gland that is located near the tube that  carries urine out of your body). There are two reasons why the prostate can cause an increased frequency of urination:  An enlarged prostate that does not let the bladder empty well. If the bladder only half empties when you urinate, then it only has half the capacity to fill before you have to urinate again.  The nerves in the bladder become more hypersensitive with an increased size of the prostate even if the bladder empties completely.  Pregnancy.  Obesity. Excess weight is more likely to cause a problem for women than for men.  Bladder stones or other bladder problems.  Caffeine.  Alcohol.  Medications. For example, drugs that help the body get rid of extra fluid (diuretics) increase urine production. Some other medicines must be taken with lots of fluids.  Muscle or nerve weakness. This might be the result of a spinal cord injury, a stroke, multiple sclerosis, or Parkinson disease.  Long-standing diabetes can decrease the sensation of the bladder. This loss of sensation makes it harder to sense the bladder needs to be emptied. Over a period of years, the bladder is stretched out by constant overfilling. This weakens the bladder muscles so that the bladder does not empty well and has less capacity to fill with new urine.  Interstitial cystitis (also called painful bladder syndrome). This condition develops because the tissues that line the inside of the bladder are inflamed (inflammation is the body's way of reacting to injury or  infection). It causes pain and frequent urination. It occurs in women more often than in men. DIAGNOSIS   To decide what might be causing your urinary frequency, your health care provider will probably:  Ask about symptoms you have noticed.  Ask about your overall health. This will include questions about any medications you are taking.  Do a physical examination.  Order some tests. These might include:  A blood test to check for diabetes or other  health issues that could be contributing to the problem.  Urine testing. This could measure the flow of urine and the pressure on the bladder.  A test of your neurological system (the brain, spinal cord, and nerves). This is the system that senses the need to urinate.  A bladder test to check whether it is emptying completely when you urinate.  Cystoscopy. This test uses a thin tube with a tiny camera on it. It offers a look inside your urethra and bladder to see if there are problems.  Imaging tests. You might be given a contrast dye and then asked to urinate. X-rays are taken to see how your bladder is working. TREATMENT  It is important for you to be evaluated to determine if the amount or frequency that you have is unusual or abnormal. If it is found to be abnormal, the cause should be determined and this can usually be found out easily. Depending upon the cause, treatment could include medication, stimulation of the nerves, or surgery. There are not too many things that you can do as an individual to change your urinary frequency. It is important that you balance the amount of fluid intake needed to compensate for your activity and the temperature. Medical problems will be diagnosed and taken care of by your physician. There is no particular bladder training such as Kegel exercises that you can do to help urinary frequency. This is an exercise that is usually recommended for people who have leaking of urine when they laugh, cough, or sneeze. HOME CARE INSTRUCTIONS   Take any medications your health care provider prescribed or suggested. Follow the directions carefully.  Practice any lifestyle changes that are recommended. These might include:  Drinking less fluid or drinking at different times of the day. If you need to urinate often during the night, for example, you may need to stop drinking fluids early in the evening.  Cutting down on caffeine or alcohol. They both can make you need to  urinate more often than normal. Caffeine is found in coffee, tea, and sodas.  Losing weight, if that is recommended.  Keep a journal or a log. You might be asked to record how much you drink and when and where you feel the need to urinate. This will also help evaluate how well the treatment provided by your physician is working. SEEK MEDICAL CARE IF:   Your need to urinate often gets worse.  You feel increased pain or irritation when you urinate.  You notice blood in your urine.  You have questions about any medications that your health care provider recommended.  You notice blood, pus, or swelling at the site of any test or treatment procedure.  You develop a fever of more than 100.66F (38.1C). SEEK IMMEDIATE MEDICAL CARE IF:  You develop a fever of more than 102.55F (38.9C). Document Released: 10/23/2008 Document Revised: 05/13/2013 Document Reviewed: 10/23/2008 [facility] Patient Information 2015 [place], [place]. This information is not intended to replace advice given to you by your health care provider. Make  sure you discuss any questions you have with your health care provider.  Dysuria Dysuria is the medical term for pain with urination. There are many causes for dysuria, but urinary tract infection is the most common. If a urinalysis was performed it can show that there is a urinary tract infection. A urine culture confirms that you or your child is sick. You will need to follow up with a healthcare provider because:  If a urine culture was done you will need to know the culture results and treatment recommendations.  If the urine culture was positive, you or your child will need to be put on antibiotics or know if the antibiotics prescribed are the right antibiotics for your urinary tract infection.  If the urine culture is negative (no urinary tract infection), then other causes may need to be explored or antibiotics need to be stopped. Today laboratory work may have been  done and there does not seem to be an infection. If cultures were done they will take at least 24 to 48 hours to be completed. Today x-rays may have been taken and they read as normal. No cause can be found for the problems. The x-rays may be re-read by a radiologist and you will be contacted if additional findings are made. You or your child may have been put on medications to help with this problem until you can see your primary caregiver. If the problems get better, see your primary caregiver if the problems return. If you were given antibiotics (medications which kill germs), take all of the mediations as directed for the full course of treatment.  If laboratory work was done, you need to find the results. Leave a telephone number where you can be reached. If this is not possible, make sure you find out how you are to get test results. HOME CARE INSTRUCTIONS   Drink lots of fluids. For adults, drink eight, 8 ounce glasses of clear juice or water a day. For children, replace fluids as suggested by your caregiver.  Empty the bladder often. Avoid holding urine for long periods of time.  After a bowel movement, women should cleanse front to back, using each tissue only once.  Empty your bladder before and after sexual intercourse.  Take all the medicine given to you until it is gone. You may feel better in a few days, but TAKE ALL MEDICINE.  Avoid caffeine, tea, alcohol and carbonated beverages, because they tend to irritate the bladder.  In men, alcohol may irritate the prostate.  Only take over-the-counter or prescription medicines for pain, discomfort, or fever as directed by your caregiver.  If your caregiver has given you a follow-up appointment, it is very important to keep that appointment. Not keeping the appointment could result in a chronic or permanent injury, pain, and disability. If there is any problem keeping the appointment, you must call back to this facility for  assistance. SEEK IMMEDIATE MEDICAL CARE IF:   Back pain develops.  A fever develops.  There is nausea (feeling sick to your stomach) or vomiting (throwing up).  Problems are no better with medications or are getting worse. MAKE SURE YOU:   Understand these instructions.  Will watch your condition.  Will get help right away if you are not doing well or get worse. Document Released: 09/25/2003 Document Revised: 03/21/2011 Document Reviewed: 08/02/2007 [facility] Patient Information 2015 [place]. This information is not intended to replace advice given to you by your health care provider. Make sure you  discuss any questions you have with your health care provider. ° °

## 2014-05-09 NOTE — ED Provider Notes (Signed)
CSN: [id number]     Arrival date & time 05/09/14  1755 History   First MD Initiated Contact with Patient 05/09/14 1933     Chief Complaint  Patient presents with  . Urinary Tract Infection   (Consider location/radiation/quality/duration/timing/severity/associated sxs/prior Treatment) HPI Comments: 59 year old homeless female is complaining of irritation at the meatus with burning associated with minor dysuria, urinary frequency and urgency.   Past Medical History  Diagnosis Date  . Anxiety   . Depression    Past Surgical History  Procedure Laterality Date  . Endometrial ablation     Family History  Problem Relation Age of Onset  . Heart disease Mother   . Cancer Father    History  Substance Use Topics  . Smoking status: Never Smoker   . Smokeless tobacco: Never Used  . Alcohol Use: No   OB History    No data available     Review of Systems  Constitutional: Negative.   Respiratory: Negative.   Genitourinary: Negative for flank pain, vaginal bleeding, vaginal discharge, menstrual problem and pelvic pain.       See history of present illness.    Allergies  Effexor; Prozac; and Sulfa antibiotics  Home Medications   Prior to Admission medications   Medication Sig Start Date End Date Taking? Authorizing Provider  Vortioxetine HBr (BRINTELLIX) 10 MG TABS Take 10 mg by mouth daily.   Yes Historical Provider, MD  aspirin EC 81 MG tablet Take 81 mg by mouth 3 (three) times daily as needed for mild pain (pain).     Historical Provider, MD  cephALEXin (KEFLEX) 250 MG capsule Take 1 capsule (250 mg total) by mouth 4 (four) times daily. 05/09/14   Hayden Rasmussenavid Ricquel Foulk, NP  ibuprofen (ADVIL,MOTRIN) 600 MG tablet Take 1 tablet (600 mg total) by mouth every 8 (eight) hours as needed. 11/15/13   Ambrose FinlandValerie A Keck, NP  LORazepam (ATIVAN) 0.5 MG tablet Take 1 tablet (0.5 mg total) by mouth every 6 (six) hours as needed (anxiety / nausea / dizziness). 01/18/14   Gerhard Munchobert Lockwood, MD  Multiple Vitamin  (MULITIVITAMIN WITH MINERALS) TABS Take 1 tablet by mouth daily.     Historical Provider, MD   BP 124/79 mmHg  Pulse 70  Temp(Src) 98.2 F (36.8 C) (Oral)  Resp 18  SpO2 100% Physical Exam  Constitutional: She is oriented to person, place, and time. She appears well-developed and well-nourished. No distress.  Neck: Normal range of motion. Neck supple.  Cardiovascular: Normal rate.   Pulmonary/Chest: Effort normal. No respiratory distress.  Abdominal: Soft. There is no tenderness.  Neurological: She is alert and oriented to person, place, and time.  Skin: Skin is warm and dry.  Nursing note and vitals reviewed.   ED Course  Procedures (including critical care time) Labs Review Labs Reviewed  POCT URINALYSIS DIP (DEVICE)   Results for orders placed or performed during the hospital encounter of 05/09/14  POCT urinalysis dip (device)  Result Value Ref Range   Glucose, UA NEGATIVE NEGATIVE mg/dL   Bilirubin Urine NEGATIVE NEGATIVE   Ketones, ur NEGATIVE NEGATIVE mg/dL   Specific Gravity, Urine 1.015 1.005 - 1.030   Hgb urine dipstick NEGATIVE NEGATIVE   pH 7.0 5.0 - 8.0   Protein, ur NEGATIVE NEGATIVE mg/dL   Urobilinogen, UA 0.2 0.0 - 1.0 mg/dL   Nitrite NEGATIVE NEGATIVE   Leukocytes, UA NEGATIVE NEGATIVE     Imaging Review No results found.   MDM   1. Dysuria   2. Urinary  frequency   3. Urinary urgency    Drink lots of fluids stay well-hydrated Keflex and AZO Urine culture pending  Hayden Rasmussen, NP 05/09/14 1959  Hayden Rasmussen, NP 05/09/14 2000

## 2014-05-09 NOTE — ED Notes (Signed)
C/o poss UTI onset 1 week Sx include urinary freq/urgency and dysuria Denies fevers, chills, abd pain Alert, no signs of acute distress.

## 2014-05-10 LAB — URINE CULTURE
COLONY COUNT: NO GROWTH
Culture: NO GROWTH
SPECIAL REQUESTS: NORMAL

## 2014-12-23 ENCOUNTER — Encounter (HOSPITAL_COMMUNITY): Payer: Self-pay | Admitting: Neurology

## 2014-12-23 ENCOUNTER — Emergency Department (HOSPITAL_COMMUNITY): Payer: Medicaid Other

## 2014-12-23 ENCOUNTER — Emergency Department (HOSPITAL_COMMUNITY)
Admission: EM | Admit: 2014-12-23 | Discharge: 2014-12-23 | Disposition: A | Discharge status: Home / Self Care | Payer: Medicaid Other | Attending: Emergency Medicine | Admitting: Emergency Medicine

## 2014-12-23 DIAGNOSIS — Z7982 Long term (current) use of aspirin: Secondary | ICD-10-CM | POA: Insufficient documentation

## 2014-12-23 DIAGNOSIS — R0602 Shortness of breath: Secondary | ICD-10-CM | POA: Diagnosis not present

## 2014-12-23 DIAGNOSIS — R079 Chest pain, unspecified: Secondary | ICD-10-CM | POA: Insufficient documentation

## 2014-12-23 DIAGNOSIS — Z8659 Personal history of other mental and behavioral disorders: Secondary | ICD-10-CM | POA: Diagnosis not present

## 2014-12-23 DIAGNOSIS — R0789 Other chest pain: Secondary | ICD-10-CM

## 2014-12-23 HISTORY — DX: Scoliosis, unspecified: M41.9

## 2014-12-23 LAB — CBC
HCT: 39.4 % (ref 36.0–46.0)
HEMOGLOBIN: 13.2 g/dL (ref 12.0–15.0)
MCH: 30.4 pg (ref 26.0–34.0)
MCHC: 33.5 g/dL (ref 30.0–36.0)
MCV: 90.8 fL (ref 78.0–100.0)
Platelets: 315 10*3/uL (ref 150–400)
RBC: 4.34 MIL/uL (ref 3.87–5.11)
RDW: 13 % (ref 11.5–15.5)
WBC: 7.6 10*3/uL (ref 4.0–10.5)

## 2014-12-23 LAB — BASIC METABOLIC PANEL
ANION GAP: 10 (ref 5–15)
BUN: 14 mg/dL (ref 6–20)
CO2: 23 mmol/L (ref 22–32)
Calcium: 9.9 mg/dL (ref 8.9–10.3)
Chloride: 106 mmol/L (ref 101–111)
Creatinine, Ser: 0.91 mg/dL (ref 0.44–1.00)
GFR calc Af Amer: >60 mL/min (ref >60)
Glucose, Bld: 100 mg/dL — ABNORMAL HIGH (ref 65–99)
POTASSIUM: 3.8 mmol/L (ref 3.5–5.1)
SODIUM: 139 mmol/L (ref 135–145)

## 2014-12-23 LAB — I-STAT TROPONIN, ED
TROPONIN I, POC: 0 ng/mL (ref 0.00–0.08)
Troponin i, poc: 0 ng/mL (ref 0.00–0.08)

## 2014-12-23 MED ORDER — CETIRIZINE HCL 10 MG PO TABS: 10.0000 mg | ORAL_TABLET | Freq: Every day | ORAL | Status: DC

## 2014-12-23 NOTE — ED Notes (Addendum)
Pt reports sob and cp for 1 month. Reports living conditions are not good and cough be affecting her. Her place has mold and dust and has been using strong cleaners to clean. Denies n/v/d. Skin is warm and dry.

## 2014-12-23 NOTE — Discharge Instructions (Signed)
You were evaluated in the ED today and there does not appear to be an emergent cause for her symptoms at this time. Your exam and workup was very reassuring. Please take your allergy medication as prescribed. Follow-up with your PCP/[facility] in the next week or so for reevaluation. Return to ED for any new or worsening symptoms.

## 2014-12-23 NOTE — ED Provider Notes (Signed)
CSN: [id number]     Arrival date & time 12/23/14  1105 History   First MD Initiated Contact with Patient 12/23/14 1354     Chief Complaint  Patient presents with  . Chest Pain  . Shortness of Breath     (Consider location/radiation/quality/duration/timing/severity/associated sxs/prior Treatment) HPI Ann Scott is a 59 y.o. female history of anxiety comes in for evaluation of chest pain shortness of breath. Patient reports over the past one month since moving into a new rental apartment she has had intermittent shortness of breath and chest discomfort while using commercial cleaners. She reports her house has a lot of mold and dust that she has been using some very strong cleaners and she feels like this is causing her discomfort. She has not followed up with her PCP. She does not experience these problems when she is outside of the house. No fevers, chills, nausea or vomiting, diarrhea, abdominal pain, urinary symptoms, numbness or weakness, leg swelling. No other modifying factors. Reports mom had pacemaker implanted when she was 59 years old, otherwise No family history of cardiac problems.  Past Medical History  Diagnosis Date  . Anxiety   . Depression   . Scoliosis    Past Surgical History  Procedure Laterality Date  . Endometrial ablation     Family History  Problem Relation Age of Onset  . Heart disease Mother   . Cancer Father    Social History  Substance Use Topics  . Smoking status: Never Smoker   . Smokeless tobacco: Never Used  . Alcohol Use: No   OB History    No data available     Review of Systems A 10 point review of systems was completed and was negative except for pertinent positives and negatives as mentioned in the history of present illness     Allergies  Effexor; Prozac; and Sulfa antibiotics  Home Medications   Prior to Admission medications   Medication Sig Start Date End Date Taking? Authorizing Provider  aspirin EC 81 MG tablet Take 81 mg  by mouth 3 (three) times daily as needed for mild pain (pain).    Yes Historical Provider, MD  ENSURE (ENSURE) Take 237 mLs by mouth daily.   Yes Historical Provider, MD  ibuprofen (ADVIL,MOTRIN) 600 MG tablet Take 1 tablet (600 mg total) by mouth every 8 (eight) hours as needed. 11/15/13  Yes Ambrose FinlandValerie A Keck, NP  cetirizine (ZYRTEC ALLERGY) 10 MG tablet Take 1 tablet (10 mg total) by mouth daily. 12/23/14   Ricarda Atayde, PA-C   BP 110/66 mmHg  Pulse 69  Temp(Src) 97.7 F (36.5 C) (Oral)  Resp 20  SpO2 100% Physical Exam  Constitutional: She is oriented to person, place, and time. She appears well-developed and well-nourished.  HENT:  Head: Normocephalic and atraumatic.  Mouth/Throat: Oropharynx is clear and moist.  Eyes: Conjunctivae are normal. Pupils are equal, round, and reactive to light. Right eye exhibits no discharge. Left eye exhibits no discharge. No scleral icterus.  Neck: Neck supple.  Cardiovascular: Normal rate, regular rhythm and normal heart sounds.   Pulmonary/Chest: Effort normal and breath sounds normal. No respiratory distress. She has no wheezes. She has no rales.  Abdominal: Soft. There is no tenderness.  Musculoskeletal: She exhibits no tenderness.  Neurological: She is alert and oriented to person, place, and time.  Cranial Nerves II-XII grossly intact  Skin: Skin is warm and dry. No rash noted.  Psychiatric: She has a normal mood and affect.  Nursing  note and vitals reviewed.   ED Course  Procedures (including critical care time) Labs Review Labs Reviewed  BASIC METABOLIC PANEL - Abnormal; Notable for the following:    Glucose, Bld 100 (*)    All other components within normal limits  CBC  I-STAT TROPOININ, ED  I-STAT TROPOININ, ED    Imaging Review No results found. I have personally reviewed and evaluated these images and lab results as part of my medical decision-making.   EKG Interpretation   Date/Time:  Tuesday December 23 2014 11:12:58  EST Ventricular Rate:  96 PR Interval:  142 QRS Duration: 90 QT Interval:  350 QTC Calculation: 442 R Axis:   91 Text Interpretation:  Normal sinus rhythm Right atrial enlargement  Rightward axis Cannot rule out Anterior infarct , age undetermined  Abnormal ECG Confirmed by Lincoln Brigham [phone]) on 12/23/2014 2:46:10 PM     Meds given in ED:  Medications - No data to display  Discharge Medication List as of 12/23/2014  4:15 PM    START taking these medications   Details  cetirizine (ZYRTEC ALLERGY) 10 MG tablet Take 1 tablet (10 mg total) by mouth daily., Starting 12/23/2014, Until Discontinued, Print       Filed Vitals:   12/23/14 1445 12/23/14 1500 12/23/14 1511 12/23/14 1615  BP: 113/71 109/64 109/64 110/66  Pulse: 63 62 66 69  Temp:    97.7 F (36.5 C)  TempSrc:    Oral  Resp: SpO2: 97% 98% 100% 100%    MDM  Vitals stable - WNL -afebrile Pt resting comfortably in ED. No chest discomfort in ED PE--Lung exam benign. Cardiac auscultation reveals no murmurs rubs or gallops. Grossly Benign Physical Exam Labwork: DeltaTroponin negative. EKG reassuring. Labs otherwise noncontributory Imaging: CXR shows no acute cardiopulmonary disease  DDX: Patient presents with atypical chest discomfort has been intermittent over the past month and is not exertional. Clinical picture and exam today not consistent with ACS/dissection. Heart score 1 due to age. No evidence of spontaneous pneumothorax, esophageal rupture or other mediastinitis. Low Well's score, doubt PE. No evidence of myocarditis, endocarditis, pericarditis.  Symptoms possibly due to allergic reaction. Will DC with trial antihistamine. Prior to discharge, I discussed and reviewed this case with my attending, Dr. Madilyn Hook who also saw and evaluated the patient and agrees with plan. I discussed all relevant lab findings and imaging results with pt and they verbalized understanding. Discussed f/u with PCP next week and  return precautions, pt very amenable to plan. [facility] to establish PCP care.   Final diagnoses:  Chest discomfort      Joycie Peek, PA-C 12/27/14 1620  Tilden Fossa, MD 12/29/14 [phone]

## 2015-02-04 ENCOUNTER — Encounter (HOSPITAL_COMMUNITY): Payer: Self-pay | Admitting: Emergency Medicine

## 2015-02-04 ENCOUNTER — Emergency Department (INDEPENDENT_AMBULATORY_CARE_PROVIDER_SITE_OTHER)
Admission: EM | Admit: 2015-02-04 | Discharge: 2015-02-04 | Disposition: A | Discharge status: Home / Self Care | Payer: Medicaid Other | Source: Home / Self Care | Attending: Emergency Medicine | Admitting: Emergency Medicine

## 2015-02-04 DIAGNOSIS — Z23 Encounter for immunization: Secondary | ICD-10-CM

## 2015-02-04 DIAGNOSIS — S61219A Laceration without foreign body of unspecified finger without damage to nail, initial encounter: Secondary | ICD-10-CM | POA: Diagnosis not present

## 2015-02-04 MED ORDER — TETANUS-DIPHTH-ACELL PERTUSSIS 5-2.5-18.5 LF-MCG/0.5 IM SUSP
INTRAMUSCULAR | Status: AC
  Filled 2015-02-04: qty 0.5

## 2015-02-04 MED ORDER — BACITRACIN ZINC 500 UNIT/GM EX OINT
TOPICAL_OINTMENT | CUTANEOUS | Status: AC
  Filled 2015-02-04: qty 0.9

## 2015-02-04 MED ORDER — TETANUS-DIPHTH-ACELL PERTUSSIS 5-2.5-18.5 LF-MCG/0.5 IM SUSP
0.5000 mL | Freq: Once | INTRAMUSCULAR | Status: AC
  Administered 2015-02-04: 0.5 mL via INTRAMUSCULAR

## 2015-02-04 NOTE — ED Provider Notes (Signed)
CSN: [id number]     Arrival date & time 02/04/15  1818 History   First MD Initiated Contact with Patient 02/04/15 2035     Chief Complaint  Patient presents with  . Immunizations   (Consider location/radiation/quality/duration/timing/severity/associated sxs/prior Treatment) HPI  She is a 60 year old woman here for right middle finger laceration. She is currently living in a home that she describes as filthy. She was trying to clean the floors today when she cut her finger. The cut is not deep, but she is concerned about tetanus and infection. She states the water in the apartment is gray so she cannot use it to wash the cut. Her last tetanus was 10 years ago.  Past Medical History  Diagnosis Date  . Anxiety   . Depression   . Scoliosis    Past Surgical History  Procedure Laterality Date  . Endometrial ablation     Family History  Problem Relation Age of Onset  . Heart disease Mother   . Cancer Father    Social History  Substance Use Topics  . Smoking status: Never Smoker   . Smokeless tobacco: Never Used  . Alcohol Use: No   OB History    No data available     Review of Systems As in history of present illness Allergies  Effexor; Prozac; and Sulfa antibiotics  Home Medications   Prior to Admission medications   Medication Sig Start Date End Date Taking? Authorizing Provider  aspirin EC 81 MG tablet Take 81 mg by mouth 3 (three) times daily as needed for mild pain (pain).    Yes Historical Provider, MD  cetirizine (ZYRTEC ALLERGY) 10 MG tablet Take 1 tablet (10 mg total) by mouth daily. 12/23/14   Joycie Peek, PA-C  ENSURE (ENSURE) Take 237 mLs by mouth daily.    Historical Provider, MD  ibuprofen (ADVIL,MOTRIN) 600 MG tablet Take 1 tablet (600 mg total) by mouth every 8 (eight) hours as needed. 11/15/13   Ambrose Finland, NP   Meds Ordered and Administered this Visit   Medications  Tdap (BOOSTRIX) injection 0.5 mL (not administered)    BP 132/75 mmHg  Pulse  63  Temp(Src) 97.1 F (36.2 C) (Oral)  Resp 16  SpO2 100% No data found.   Physical Exam  Constitutional: She is oriented to person, place, and time. She appears well-developed and well-nourished. No distress.  Cardiovascular: Normal rate.   Pulmonary/Chest: Effort normal.  Neurological: She is alert and oriented to person, place, and time.  Skin:  0.5 cm superficial laceration to the pad of the right middle finger.    ED Course  Procedures (including critical care time)  Labs Review Labs Reviewed - No data to display  Imaging Review No results found.    MDM   1. Laceration of finger, initial encounter    No need for suturing or Dermabond. Wound cleaned and bandaged here. Tetanus updated. Follow-up as needed.    Charm Rings, MD 02/04/15 2110

## 2015-02-04 NOTE — Discharge Instructions (Signed)
We updated your tetanus today. Please keep the cut clean and dry. Wash it with soap and water twice a day. Use the bacitracin ointment twice a day for the next 3 days. Follow-up as needed.

## 2015-02-04 NOTE — ED Notes (Signed)
Patient has a very small scratch to the tip of right middle finger, no bleeding.  Patient concerned she needs a tetanus.

## 2015-04-05 ENCOUNTER — Emergency Department (HOSPITAL_COMMUNITY): Payer: Medicaid Other

## 2015-04-05 ENCOUNTER — Emergency Department (HOSPITAL_COMMUNITY)
Admission: EM | Admit: 2015-04-05 | Discharge: 2015-04-05 | Disposition: A | Discharge status: Home / Self Care | Payer: Medicaid Other | Attending: Emergency Medicine | Admitting: Emergency Medicine

## 2015-04-05 ENCOUNTER — Encounter (HOSPITAL_COMMUNITY): Payer: Self-pay | Admitting: Oncology

## 2015-04-05 DIAGNOSIS — R05 Cough: Secondary | ICD-10-CM | POA: Insufficient documentation

## 2015-04-05 DIAGNOSIS — M25511 Pain in right shoulder: Secondary | ICD-10-CM | POA: Diagnosis not present

## 2015-04-05 DIAGNOSIS — R0602 Shortness of breath: Secondary | ICD-10-CM | POA: Insufficient documentation

## 2015-04-05 DIAGNOSIS — Z79899 Other long term (current) drug therapy: Secondary | ICD-10-CM | POA: Diagnosis not present

## 2015-04-05 DIAGNOSIS — Z7982 Long term (current) use of aspirin: Secondary | ICD-10-CM | POA: Insufficient documentation

## 2015-04-05 DIAGNOSIS — M419 Scoliosis, unspecified: Secondary | ICD-10-CM | POA: Insufficient documentation

## 2015-04-05 DIAGNOSIS — R0789 Other chest pain: Secondary | ICD-10-CM | POA: Diagnosis not present

## 2015-04-05 DIAGNOSIS — R0781 Pleurodynia: Secondary | ICD-10-CM | POA: Diagnosis present

## 2015-04-05 DIAGNOSIS — F419 Anxiety disorder, unspecified: Secondary | ICD-10-CM | POA: Insufficient documentation

## 2015-04-05 MED ORDER — CYCLOBENZAPRINE HCL 10 MG PO TABS
5.0000 mg | ORAL_TABLET | Freq: Once | ORAL | Status: AC
  Administered 2015-04-05: 0.5 mg via ORAL
  Filled 2015-04-05: qty 1

## 2015-04-05 MED ORDER — CYCLOBENZAPRINE HCL 5 MG PO TABS: 5.0000 mg | ORAL_TABLET | Freq: Three times a day (TID) | ORAL | Status: DC | PRN

## 2015-04-05 MED ORDER — NAPROXEN 250 MG PO TABS: ORAL_TABLET | ORAL | Status: DC

## 2015-04-05 MED ORDER — IBUPROFEN 100 MG/5ML PO SUSP
600.0000 mg | Freq: Once | ORAL | Status: AC
  Administered 2015-04-05: 600 mg via ORAL
  Filled 2015-04-05: qty 30

## 2015-04-05 NOTE — ED Provider Notes (Signed)
CSN: [id number]     Arrival date & time 04/05/15  0208 History  By signing my name below, I, Phillis Haggis, attest that this documentation has been prepared under the direction and in the presence of Devoria Albe, MD at 0313 AM. Electronically Signed: Phillis Haggis, ED Scribe. 04/05/2015. 6:51 AM.   Chief Complaint  Patient presents with  . rib pain    The history is provided by the patient. No language interpreter was used.  HPI Comments: Ann Scott is a 60 y.o. Female with a hx of scoliosis who presents to the Emergency Department complaining of bottom right rib cage pain onset 2 days ago. Pt states that she has been sleeping on an air mattress that deflated and was then on the flat floor when she woke up 2 mornings ago. She reports that it aggravated her back pain when she tried to get up from the ground because it is hard for her to maneuver with her scoliosis and started hurting later in the day. She reports associated posterior right shoulder pain and SOB. She reports that she is unable to lay down due to pain; the pain worsens with deep breathing, position changes, and coughing. Pt does not have a PCP and is on disability for depression, anxiety, scoliosis and DDD. Pt lives alone and states that she will move in with her sister because she is unable to take care of herself. States she has been unable to wash her clothes and she is unable to bathe because [facility] She states she is unable to do that now. She denies hx of smoking and drinking.  PCP none   Past Medical History  Diagnosis Date  . Anxiety   . Depression   . Scoliosis    Past Surgical History  Procedure Laterality Date  . Endometrial ablation     Family History  Problem Relation Age of Onset  . Heart disease Mother   . Cancer Father    Social History  Substance Use Topics  . Smoking status: Never Smoker   . Smokeless tobacco: Never Used  . Alcohol Use: No    Lives alone, moving in with her sister in April.  Just got on disability for her scoliosis, arthritis, anxiety and depression  OB History    No data available     Review of Systems  Cardiovascular: Positive for chest pain (right rib pain).  All other systems reviewed and are negative.  Allergies  Effexor; Prozac; and Sulfa antibiotics  Home Medications   Prior to Admission medications   Medication Sig Start Date End Date Taking? Authorizing Provider  aspirin EC 81 MG tablet Take 81 mg by mouth 3 (three) times daily as needed for mild pain (pain).    Yes Historical Provider, MD  ENSURE (ENSURE) Take 237 mLs by mouth daily.   Yes Historical Provider, MD  Multiple Vitamin (MULTIVITAMIN WITH MINERALS) TABS tablet Take 1 tablet by mouth daily.   Yes Historical Provider, MD  cetirizine (ZYRTEC ALLERGY) 10 MG tablet Take 1 tablet (10 mg total) by mouth daily. Patient not taking: Reported on 04/05/2015 12/23/14   Joycie Peek, PA-C  cyclobenzaprine (FLEXERIL) 5 MG tablet Take 1 tablet (5 mg total) by mouth 3 (three) times daily as needed for muscle spasms. 04/05/15   Devoria Albe, MD  ibuprofen (ADVIL,MOTRIN) 600 MG tablet Take 1 tablet (600 mg total) by mouth every 8 (eight) hours as  needed. Patient not taking: Reported on 04/05/2015 11/15/13   Ambrose Finland, NP  naproxen (NAPROSYN) 250 MG tablet Take 1 po BID with food prn pain 04/05/15   Devoria Albe, MD   BP 111/66 mmHg  Pulse 75  Temp(Src) 98.4 F (36.9 C) (Oral)  Resp 17  Ht  (1.626 m)  Wt 135 lb (61.236 kg)  BMI 23.16 kg/m2  SpO2 96%  Vital signs normal   Physical Exam  Constitutional: She is oriented to person, place, and time. She appears well-developed and well-nourished.  Non-toxic appearance. She does not appear ill. No distress.  HENT:  Head: Normocephalic and atraumatic.  Right Ear: External ear normal.  Left Ear: External ear normal.  Nose: Nose normal. No mucosal edema or rhinorrhea.  Mouth/Throat: Oropharynx  is clear and moist and mucous membranes are normal. No dental abscesses or uvula swelling.  Eyes: Conjunctivae and EOM are normal. Pupils are equal, round, and reactive to light.  Neck: Normal range of motion and full passive range of motion without pain. Neck supple.  Cardiovascular: Normal rate, regular rhythm and normal heart sounds.  Exam reveals no gallop and no friction rub.   No murmur heard. Pulmonary/Chest: Effort normal and breath sounds normal. No respiratory distress. She has no wheezes. She has no rhonchi. She has no rales.   She exhibits no tenderness and no crepitus.  Area of posterior chest pain noted, tender right trapezius.   Abdominal: Soft. Normal appearance and bowel sounds are normal. She exhibits no distension. There is no tenderness. There is no rebound and no guarding.  Musculoskeletal: Normal range of motion. She exhibits no edema or tenderness.  Tenderness posterior to the posterior right axillary line; Moves all extremities well.   Neurological: She is alert and oriented to person, place, and time. She has normal strength. No cranial nerve deficit.  Skin: Skin is warm, dry and intact. No rash noted. No erythema. No pallor.  Psychiatric: Her speech is normal and behavior is normal. Her mood appears anxious.  Nursing note and vitals reviewed.   ED Course  Procedures (including critical care time)  Medications  ibuprofen (ADVIL,MOTRIN) 100 MG/5ML suspension 600 mg (600 mg Oral Given 04/05/15 0442)  cyclobenzaprine (FLEXERIL) tablet 5 mg (0.5 mg Oral Given 04/05/15 0441)    DIAGNOSTIC STUDIES: Oxygen Saturation is 97% on RA, normal by my interpretation.    COORDINATION OF CARE: 3:24 AM-Discussed treatment plan which includes x-rays with pt at bedside and pt agreed to plan.   Pt was given her xray results. Her pain is better, not gone.   Imaging Review Dg Ribs Unilateral W/chest Right  04/05/2015  CLINICAL DATA:  60 year old female with right posterior chest  pain. Concern for rib fracture. EXAM: RIGHT RIBS AND CHEST - 3+ VIEW COMPARISON:  Chest radiograph dated 12/23/2014 FINDINGS: There is no acute rib fracture. The bones are osteopenic. There is scoliosis of the lumbar spine. Bibasilar atelectasis/scarring. No focal consolidation or pneumothorax. No pleural effusion. The cardiac silhouette is within normal limits. IMPRESSION: No rib fracture or pneumothorax. Electronically Signed   By: Elgie Collard M.D.   On: 04/05/2015 04:14   I have personally reviewed and evaluated these images and lab results as part of my medical decision-making.    MDM   Final diagnoses:  Chest wall pain    New Prescriptions   CYCLOBENZAPRINE (FLEXERIL) 5 MG TABLET    Take 1 tablet (5 mg total) by mouth 3 (three) times daily as needed for  muscle spasms.   NAPROXEN (NAPROSYN) 250 MG TABLET    Take 1 po BID with food prn pain    Plan discharge  Devoria AlbeIva Kayona Foor, MD, FACEP   I personally performed the services described in this documentation, which was scribed in my presence. The recorded information has been reviewed and considered.  Devoria AlbeIva Emonii Wienke, MD, Concha PyoFACEP   Kristia Jupiter, MD 04/05/15 [phone]

## 2015-04-05 NOTE — ED Notes (Signed)
Pt reports sleeping on the floor and turned over to get up and has had pain in her right rib cage since.

## 2015-04-05 NOTE — Discharge Instructions (Signed)
Take the medications for pain as prescribed. You can use ice and heat on your chest for comfort. Recheck if you get a fever, cough, struggle to breathe or seem worse.

## 2015-10-31 ENCOUNTER — Ambulatory Visit (HOSPITAL_COMMUNITY)
Admission: EM | Admit: 2015-10-31 | Discharge: 2015-10-31 | Disposition: A | Discharge status: Home / Self Care | Payer: Medicaid Other | Attending: Family Medicine | Admitting: Family Medicine

## 2015-10-31 ENCOUNTER — Encounter (HOSPITAL_COMMUNITY): Payer: Self-pay | Admitting: Family Medicine

## 2015-10-31 DIAGNOSIS — J00 Acute nasopharyngitis [common cold]: Secondary | ICD-10-CM | POA: Diagnosis not present

## 2015-10-31 MED ORDER — IPRATROPIUM BROMIDE 0.06 % NA SOLN: 2.0000 | Freq: Four times a day (QID) | NASAL | 1 refills | Status: DC

## 2015-10-31 MED ORDER — AZITHROMYCIN 250 MG PO TABS: ORAL_TABLET | ORAL | 0 refills | Status: DC

## 2015-10-31 NOTE — ED Triage Notes (Signed)
Pt here for right neck and throat pain for a few days with swelling.

## 2015-10-31 NOTE — ED Provider Notes (Signed)
[facility]    CSN: [id number] Arrival date & time: 10/31/15  1519     History   Chief Complaint No chief complaint on file.   HPI Ann Scott is a 60 y.o. female.   The history is provided by the patient.  URI  Presenting symptoms: congestion, rhinorrhea and sore throat   Presenting symptoms: no cough, no ear pain and no fever   Severity:  Mild Onset quality:  Gradual Duration:  10 days Progression:  Unchanged Chronicity:  New Relieved by:  None tried Worsened by:  Nothing Ineffective treatments:  None tried Associated symptoms: swollen glands     Past Medical History:  Diagnosis Date  . Anxiety   . Depression   . Scoliosis     There are no active problems to display for this patient.   Past Surgical History:  Procedure Laterality Date  . ENDOMETRIAL ABLATION      OB History    No data available       Home Medications    Prior to Admission medications   Medication Sig Start Date End Date Taking? Authorizing Provider  aspirin EC 81 MG tablet Take 81 mg by mouth 3 (three) times daily as needed for mild pain (pain).     Historical Provider, MD  cetirizine (ZYRTEC ALLERGY) 10 MG tablet Take 1 tablet (10 mg total) by mouth daily. Patient not taking: Reported on 04/05/2015 12/23/14   Joycie Peek, PA-C  cyclobenzaprine (FLEXERIL) 5 MG tablet Take 1 tablet (5 mg total) by mouth 3 (three) times daily as needed for muscle spasms. 04/05/15   Devoria Albe, MD  ENSURE (ENSURE) Take 237 mLs by mouth daily.    Historical Provider, MD  ibuprofen (ADVIL,MOTRIN) 600 MG tablet Take 1 tablet (600 mg total) by mouth every 8 (eight) hours as needed. Patient not taking: Reported on 04/05/2015 11/15/13   Ambrose Finland, NP  Multiple Vitamin (MULTIVITAMIN WITH MINERALS) TABS tablet Take 1 tablet by mouth daily.    Historical Provider, MD  naproxen (NAPROSYN) 250 MG tablet Take 1 po BID with food prn pain 04/05/15   Devoria Albe, MD    Family History Family History   Problem Relation Age of Onset  . Heart disease Mother   . Cancer Father     Social History Social History  Substance Use Topics  . Smoking status: Never Smoker  . Smokeless tobacco: Never Used  . Alcohol use No     Allergies   Effexor [venlafaxine]; Prozac [fluoxetine]; and Sulfa antibiotics   Review of Systems Review of Systems  Constitutional: Negative.  Negative for fever.  HENT: Positive for congestion, rhinorrhea and sore throat. Negative for ear pain.   Respiratory: Negative for cough.   Cardiovascular: Negative.   Gastrointestinal: Negative.   Skin: Negative.   Hematological: Positive for adenopathy.  All other systems reviewed and are negative.    Physical Exam Triage Vital Signs ED Triage Vitals  Enc Vitals Group     BP      Pulse      Resp      Temp      Temp src      SpO2      Weight      Height      Head Circumference      Peak Flow      Pain Score      Pain Loc      Pain Edu?      Excl. in  GC?    No data found.   Updated Vital Signs There were no vitals taken for this visit.  Visual Acuity Right Eye Distance:   Left Eye Distance:   Bilateral Distance:    Right Eye Near:   Left Eye Near:    Bilateral Near:     Physical Exam  Constitutional: She appears well-developed and well-nourished. No distress.  HENT:  Right Ear: External ear normal.  Left Ear: External ear normal.  Mouth/Throat: No oropharyngeal exudate.  Eyes: Conjunctivae and EOM are normal. Pupils are equal, round, and reactive to light.  Neck: Normal range of motion. Neck supple.  Cardiovascular: Normal rate, regular rhythm and normal heart sounds.   Pulmonary/Chest: Effort normal and breath sounds normal.  Lymphadenopathy:    She has cervical adenopathy.  Skin: Skin is warm and dry.  Nursing note and vitals reviewed.    UC Treatments / Results  Labs (all labs ordered are listed, but only abnormal results are displayed) Labs Reviewed - No data to  display  EKG  EKG Interpretation None       Radiology No results found.  Procedures Procedures (including critical care time)  Medications Ordered in UC Medications - No data to display   Initial Impression / Assessment and Plan / UC Course  I have reviewed the triage vital signs and the nursing notes.  Pertinent labs & imaging results that were available during my care of the patient were reviewed by me and considered in my medical decision making (see chart for details).  Clinical Course      Final Clinical Impressions(s) / UC Diagnoses   Final diagnoses:  None    New Prescriptions New Prescriptions   No medications on file     Linna HoffJames D Kindl, MD 10/31/15 1657

## 2017-01-16 IMAGING — CR DG RIBS W/ CHEST 3+V*R*
3 series · 3 of 3 positions shown · non-contrast
Comparison: Chest radiograph dated 12/23/2014

CLINICAL DATA: 60-year-old female with right posterior chest pain.
Concern for rib fracture.

EXAM:
RIGHT RIBS AND CHEST - 3+ VIEW

[w chest pa]
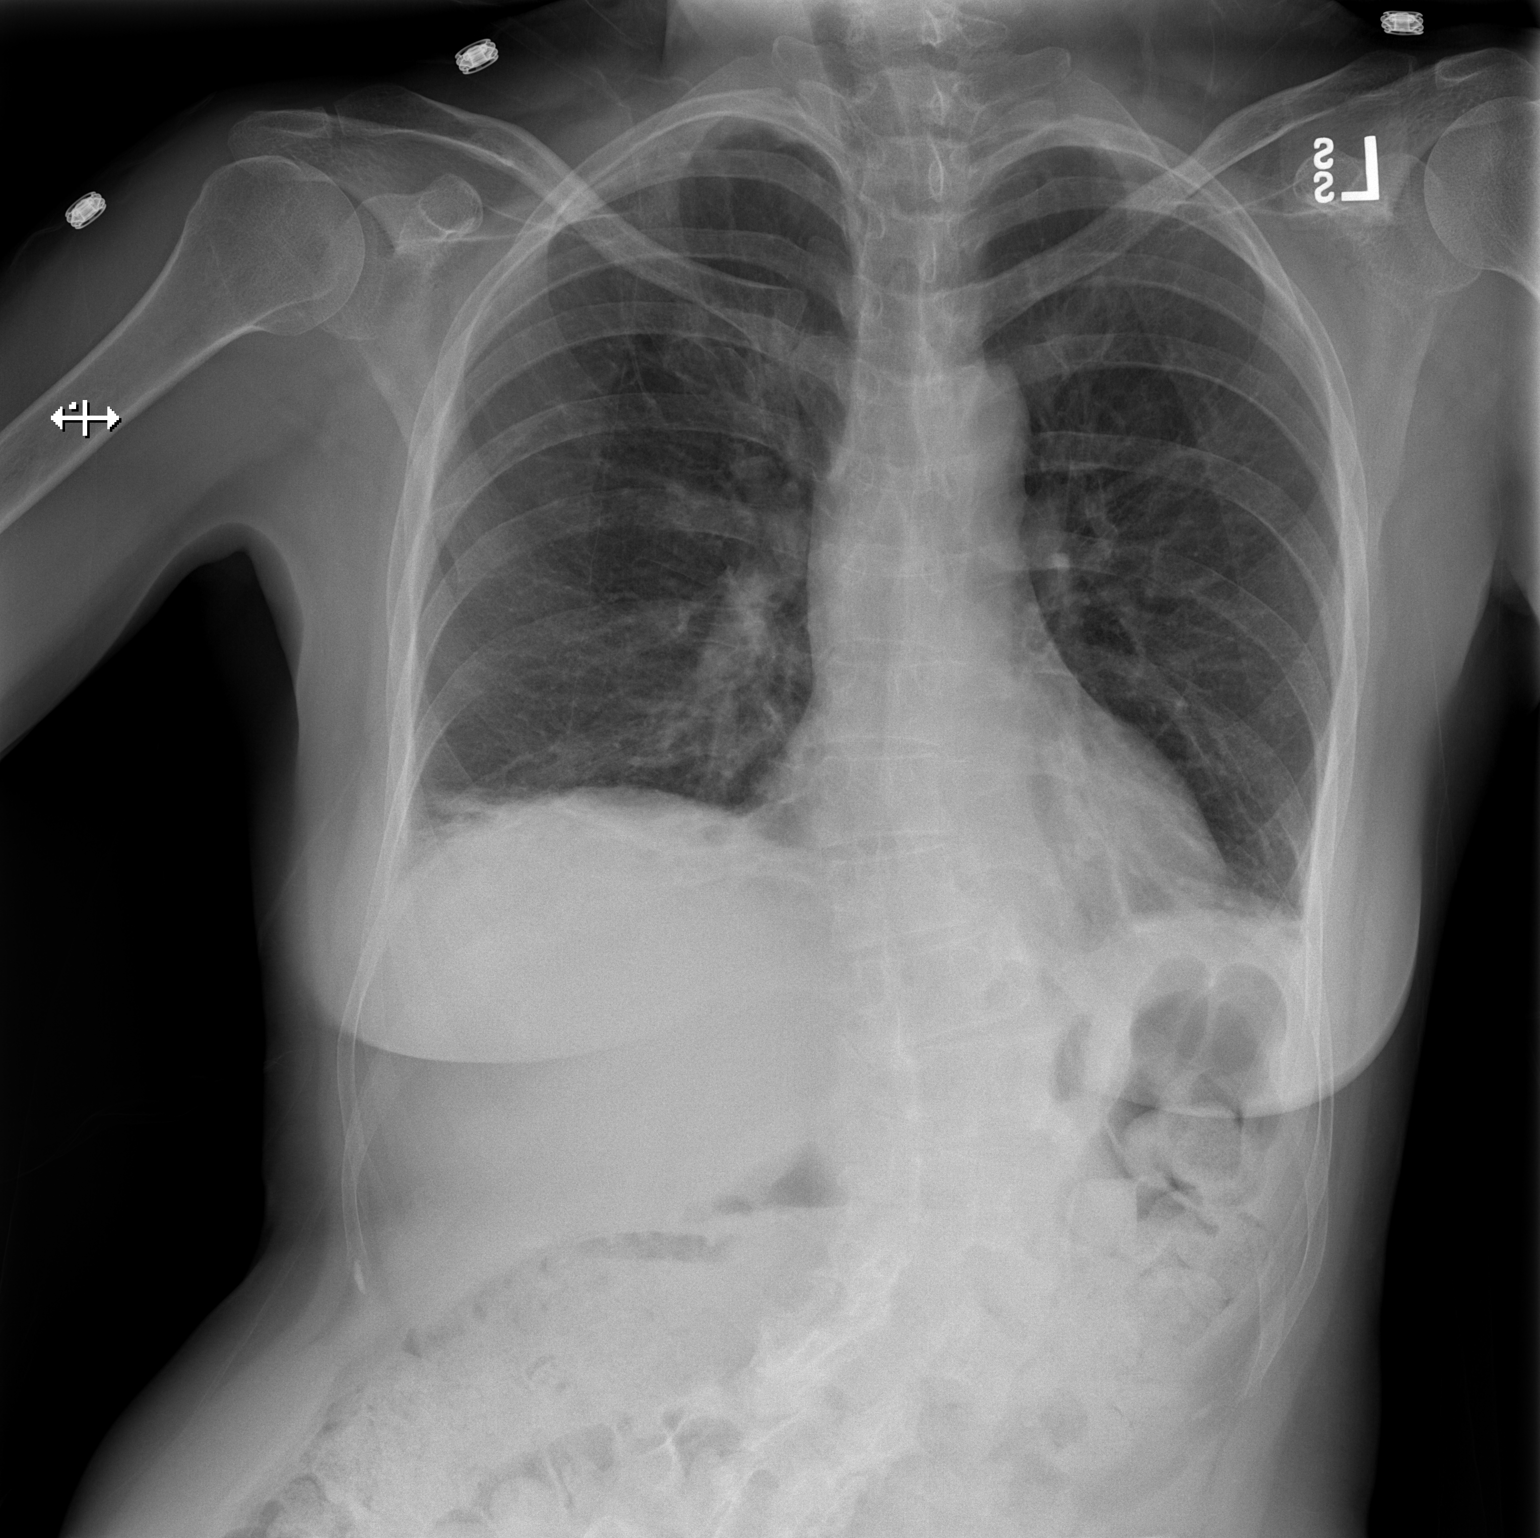

[w ribs ap upper right]
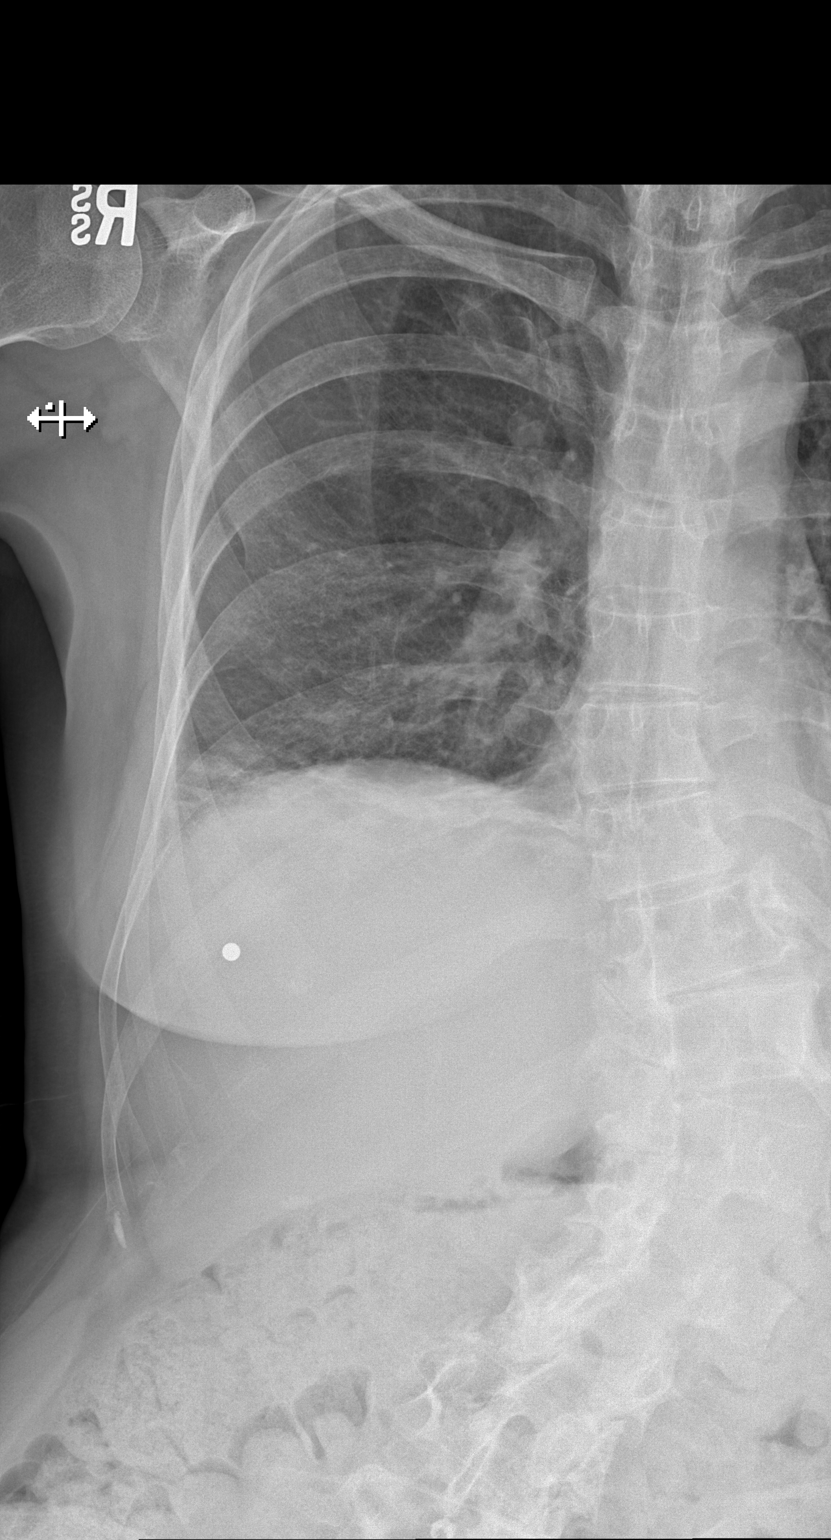

[w ribs ap lower right]
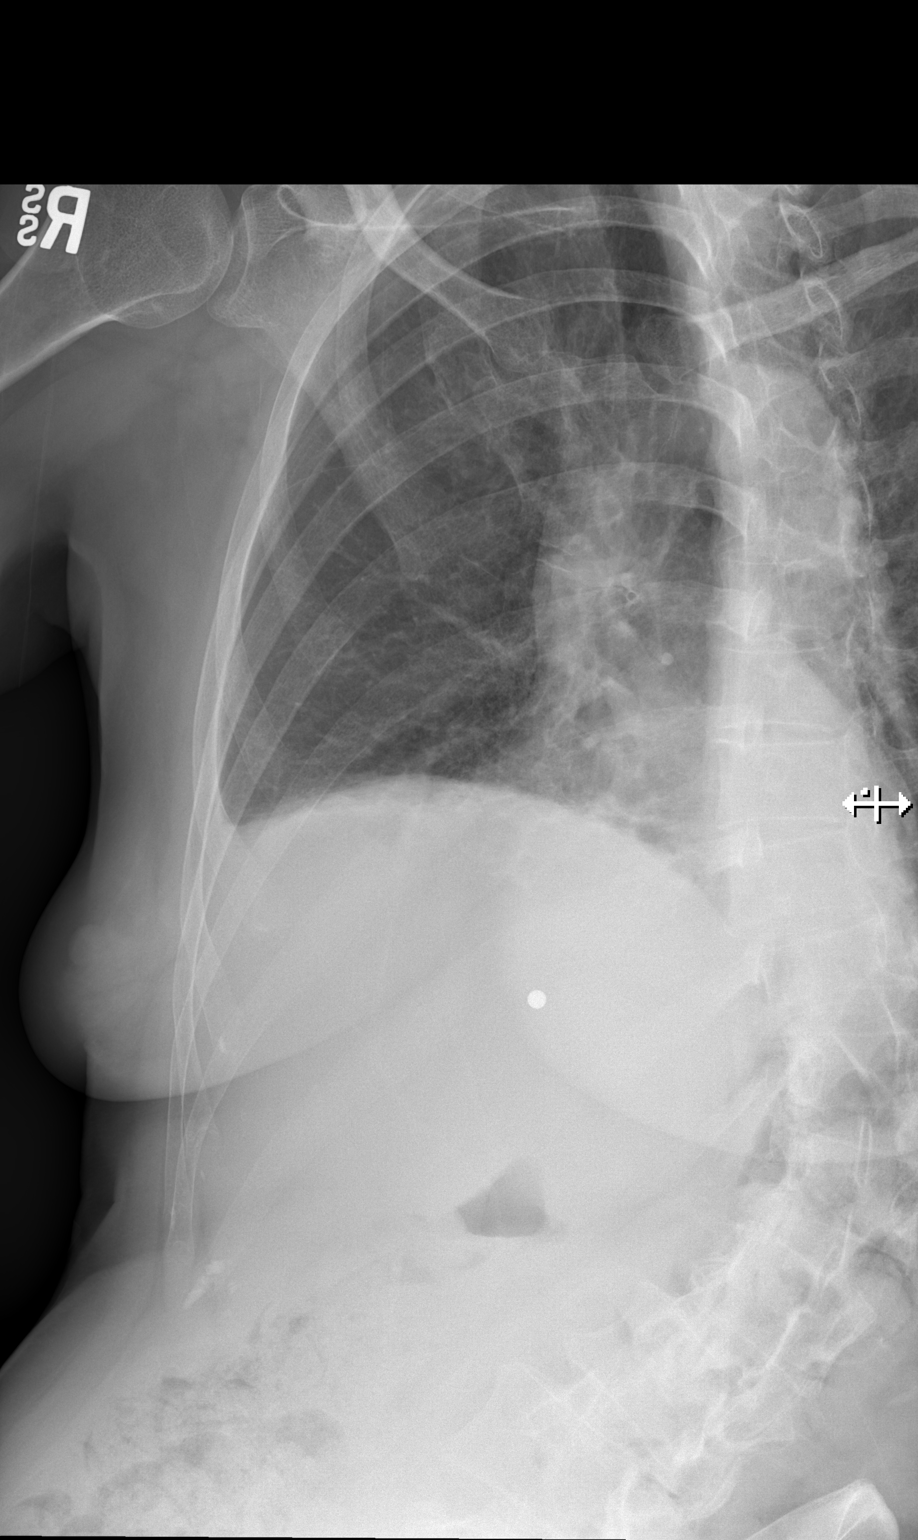

[3 of 3 positions shown; findings below may reference images not displayed]

FINDINGS: There is no acute rib fracture. The bones are osteopenic. There is
scoliosis of the lumbar spine.

Bibasilar atelectasis/scarring. No focal consolidation or
pneumothorax. No pleural effusion. The cardiac silhouette is within
normal limits.
IMPRESSION: No rib fracture or pneumothorax.

## 2017-11-09 ENCOUNTER — Encounter (HOSPITAL_COMMUNITY): Payer: Self-pay | Admitting: *Deleted

## 2017-11-09 ENCOUNTER — Other Ambulatory Visit: Payer: Self-pay

## 2017-11-09 ENCOUNTER — Ambulatory Visit (HOSPITAL_COMMUNITY)
Admission: EM | Admit: 2017-11-09 | Discharge: 2017-11-09 | Disposition: A | Discharge status: Home / Self Care | Payer: Medicaid Other | Attending: Family Medicine | Admitting: Family Medicine

## 2017-11-09 DIAGNOSIS — S46912A Strain of unspecified muscle, fascia and tendon at shoulder and upper arm level, left arm, initial encounter: Secondary | ICD-10-CM | POA: Diagnosis not present

## 2017-11-09 HISTORY — DX: Unspecified osteoarthritis, unspecified site: M19.90

## 2017-11-09 MED ORDER — IBUPROFEN 800 MG PO TABS
800.0000 mg | ORAL_TABLET | Freq: Once | ORAL | Status: AC
  Administered 2017-11-09: 800 mg via ORAL

## 2017-11-09 MED ORDER — IBUPROFEN 800 MG PO TABS
ORAL_TABLET | ORAL | Status: AC
  Filled 2017-11-09: qty 1

## 2017-11-09 MED ORDER — NAPROXEN 375 MG PO TABS: 375.0000 mg | ORAL_TABLET | Freq: Two times a day (BID) | ORAL | 0 refills | Status: DC

## 2017-11-09 NOTE — Discharge Instructions (Signed)
Your EKG is reassuring today- I do not see any indications of a heart attack.  This appears to be strain from the physical activity you have been doing.  Try to push rather than pull your cart to prevent strain.  See exercises provided as well.  Naproxen twice a day, take with food.  Please establish with a primary care provider for follow up as needed.

## 2017-11-09 NOTE — ED Provider Notes (Signed)
[facility]    CSN: [id number] Arrival date & time: 11/09/17  1040     History   Chief Complaint Chief Complaint  Patient presents with  . Shoulder Pain    HPI Ann Scott is a 62 y.o. female.   Ann Scott presents with complaints of left shoulder and arm pain which has been persistent over the past few days- weeks. She has recently been displaced from her living situation with her sister and is boarding in a room on the second level. States she walks multiple miles at a time pulling her cart of belongings or groceries, often using her left arm pulling behind her, then carries all of her things up to her second floor room. She is sleeping on an air mattress. Pain is worse at night. No chest pain , no shortness of breath , no nausea, vomiting or diaphoresis. Arm feels aching. Takes aspirin occasionally which does help. She is right handed. No numbness or tingling. Hx of anxiety, arthritis, depression, scoliosis. Doesn't have a current PCP.     ROS per HPI.      Past Medical History:  Diagnosis Date  . Anxiety   . Arthritis   . Depression   . Scoliosis     There are no active problems to display for this patient.   Past Surgical History:  Procedure Laterality Date  . ENDOMETRIAL ABLATION      OB History   None      Home Medications    Prior to Admission medications   Medication Sig Start Date End Date Taking? Authorizing Provider  aspirin EC 81 MG tablet Take 81 mg by mouth 3 (three) times daily as needed for mild pain (pain).     [provider]  ENSURE (ENSURE) Take 237 mLs by mouth daily.    [provider]  Multiple Vitamin (MULTIVITAMIN WITH MINERALS) TABS tablet Take 1 tablet by mouth daily.    [provider]  naproxen (NAPROSYN) 375 MG tablet Take 1 tablet (375 mg total) by mouth 2 (two) times daily. 11/09/17   Georgetta Haber, NP  diphenhydrAMINE (BENADRYL) 25 MG tablet Take 1 tablet (25 mg total) by mouth every 6  (six) hours. Patient not taking: Reported on 01/18/2014 11/27/13 05/09/14  Fayrene Helper, PA-C    Family History Family History  Problem Relation Age of Onset  . Heart disease Mother   . Cancer Father     Social History Social History   Tobacco Use  . Smoking status: Never Smoker  . Smokeless tobacco: Never Used  Substance Use Topics  . Alcohol use: No  . Drug use: No     Allergies   Effexor [venlafaxine]; Prozac [fluoxetine]; and Sulfa antibiotics   Review of Systems Review of Systems   Physical Exam Triage Vital Signs ED Triage Vitals  Enc Vitals Group     BP 11/09/17 1059 122/80     Pulse Rate 11/09/17 1059 74     Resp 11/09/17 1059 14     Temp 11/09/17 1059 98.2 F (36.8 C)     Temp Source 11/09/17 1059 Oral     SpO2 11/09/17 1059 99 %     Weight --      Height --      Head Circumference --      Peak Flow --      Pain Score 11/09/17 1101 3     Pain Loc --      Pain Edu? --  Excl. in GC? --    No data found.  Updated Vital Signs BP 122/80 (BP Location: Left Arm)   Pulse 74   Temp 98.2 F (36.8 C) (Oral)   Resp 14   SpO2 99%   Visual Acuity Right Eye Distance:   Left Eye Distance:   Bilateral Distance:    Right Eye Near:   Left Eye Near:    Bilateral Near:     Physical Exam  Constitutional: She is oriented to person, place, and time. Vital signs are normal. She appears well-developed. No distress.  Unkempt appearance   Cardiovascular: Normal rate, regular rhythm and normal heart sounds.  Pulmonary/Chest: Effort normal and breath sounds normal.  Musculoskeletal:       Left shoulder: She exhibits pain. She exhibits normal range of motion, no tenderness, no bony tenderness, no swelling, no effusion, no crepitus, no deformity, no spasm, normal pulse and normal strength.       Left elbow: Normal.       Left wrist: Normal.       Left upper arm: She exhibits no tenderness, no bony tenderness, no swelling, no edema, no deformity and no  laceration.  Describes pain to posterior upper arm to elbow; no specific point tenderness; achy description; full ROM of left arm at all joints; strong radial pulse; no redness or swelling noted  Neurological: She is alert and oriented to person, place, and time.  Skin: Skin is warm and dry.   ekg NSR rate 64 without acute findings concerning for acs, reviewed with supervising physician dr. Tracie Harrier  UC Treatments / Results  Labs (all labs ordered are listed, but only abnormal results are displayed) Labs Reviewed - No data to display  EKG None  Radiology No results found.  Procedures Procedures (including critical care time)  Medications Ordered in UC Medications  ibuprofen (ADVIL,MOTRIN) tablet 800 mg (has no administration in time range)    Initial Impression / Assessment and Plan / UC Course  I have reviewed the triage vital signs and the nursing notes.  Pertinent labs & imaging results that were available during my care of the patient were reviewed by me and considered in my medical decision making (see chart for details).     Patient under acute stress with change in living situation and physically very active with the left arm daily as well as sleeping on an air mattress. ekg reassuring. Suspect impingement vs tendonitis as source of pain. Naproxen bid recommended. Encouraged establish with pcp for management. Patient verbalized understanding and agreeable to plan.  Ambulatory out of clinic without difficulty.   Final Clinical Impressions(s) / UC Diagnoses   Final diagnoses:  Strain of left shoulder, initial encounter     Discharge Instructions     Your EKG is reassuring today- I do not see any indications of a heart attack.  This appears to be strain from the physical activity you have been doing.  Try to push rather than pull your cart to prevent strain.  See exercises provided as well.  Naproxen twice a day, take with food.  Please establish with a primary care  provider for follow up as needed.    ED Prescriptions    Medication Sig Dispense Auth. Provider   naproxen (NAPROSYN) 375 MG tablet Take 1 tablet (375 mg total) by mouth 2 (two) times daily. 20 tablet Georgetta Haber, NP     Controlled Substance Prescriptions [place] Controlled Substance Registry consulted? Not Applicable   Georgetta Haber,  NP 11/09/17 1159

## 2017-11-09 NOTE — ED Triage Notes (Signed)
States she recently started renting a room and has been pulling all her belongings in a cart up some stairs, and she is sleeping on a air mattress. Under lots of stress.

## 2017-11-15 ENCOUNTER — Emergency Department (HOSPITAL_COMMUNITY)
Admission: EM | Admit: 2017-11-15 | Discharge: 2017-11-15 | Disposition: A | Discharge status: Home / Self Care | Payer: Medicaid Other | Attending: Emergency Medicine | Admitting: Emergency Medicine

## 2017-11-15 ENCOUNTER — Encounter (HOSPITAL_COMMUNITY): Payer: Self-pay | Admitting: *Deleted

## 2017-11-15 DIAGNOSIS — B029 Zoster without complications: Secondary | ICD-10-CM | POA: Diagnosis not present

## 2017-11-15 DIAGNOSIS — Z7982 Long term (current) use of aspirin: Secondary | ICD-10-CM | POA: Diagnosis not present

## 2017-11-15 DIAGNOSIS — Z79899 Other long term (current) drug therapy: Secondary | ICD-10-CM | POA: Diagnosis not present

## 2017-11-15 DIAGNOSIS — R21 Rash and other nonspecific skin eruption: Secondary | ICD-10-CM | POA: Diagnosis present

## 2017-11-15 MED ORDER — ACYCLOVIR 400 MG PO TABS: 800.0000 mg | ORAL_TABLET | Freq: Every day | ORAL | 0 refills | Status: AC

## 2017-11-15 MED ORDER — PREDNISONE 20 MG PO TABS: 40.0000 mg | ORAL_TABLET | Freq: Every day | ORAL | 0 refills | Status: AC

## 2017-11-15 MED ORDER — PREDNISONE 20 MG PO TABS
60.0000 mg | ORAL_TABLET | ORAL | Status: AC
  Administered 2017-11-15: 60 mg via ORAL
  Filled 2017-11-15: qty 3

## 2017-11-15 MED ORDER — ACYCLOVIR 200 MG PO CAPS
800.0000 mg | ORAL_CAPSULE | Freq: Once | ORAL | Status: AC
  Filled 2017-11-15: qty 4

## 2017-11-15 NOTE — ED Provider Notes (Signed)
[facility] EMERGENCY DEPARTMENT Provider Note   CSN: [id number] Arrival date & time: 11/15/17  1323     History   Chief Complaint Chief Complaint  Patient presents with  . Rash    HPI Ann Scott is a 62 y.o. female.  HPI Presents with concern of pain, cutaneous changes, left arm. Patient has a difficult social situation, notes that she is under substantial stress. She notes that about 1 week ago she began having pain in her left paraspinal cervical region, radiating down left arm. She was subsequently developed a rash. Pat rash is sore, itchy, burning, with numbness. No loss of sensation in the hand, wrist, no fever, no chills, no relief with OTC medication.  She notes that she does not recall having chickenpox as a child, but she did have a sister who had active infection, and she was exposed to this.  Past Medical History:  Diagnosis Date  . Anxiety   . Arthritis   . Depression   . Scoliosis     There are no active problems to display for this patient.   Past Surgical History:  Procedure Laterality Date  . ENDOMETRIAL ABLATION       OB History   None      Home Medications    Prior to Admission medications   Medication Sig Start Date End Date Taking? Authorizing Provider  acyclovir (ZOVIRAX) 400 MG tablet Take 2 tablets (800 mg total) by mouth 5 (five) times daily for 7 days. 11/15/17 11/22/17  Gerhard Munch, MD  aspirin EC 81 MG tablet Take 81 mg by mouth 3 (three) times daily as needed for mild pain (pain).     [provider]  ENSURE (ENSURE) Take 237 mLs by mouth daily.    [provider]  Multiple Vitamin (MULTIVITAMIN WITH MINERALS) TABS tablet Take 1 tablet by mouth daily.    [provider]  naproxen (NAPROSYN) 375 MG tablet Take 1 tablet (375 mg total) by mouth 2 (two) times daily. 11/09/17   Georgetta Haber, NP  predniSONE (DELTASONE) 20 MG tablet Take 2 tablets (40 mg total) by mouth daily with  breakfast for 6 days. For the next four days 11/16/17 11/22/17  Gerhard Munch, MD    Family History Family History  Problem Relation Age of Onset  . Heart disease Mother   . Cancer Father     Social History Social History   Tobacco Use  . Smoking status: Never Smoker  . Smokeless tobacco: Never Used  Substance Use Topics  . Alcohol use: No  . Drug use: No     Allergies   Effexor [venlafaxine]; Prozac [fluoxetine]; and Sulfa antibiotics   Review of Systems Review of Systems  Constitutional:       Per HPI, otherwise negative  HENT:       Per HPI, otherwise negative  Respiratory:       Per HPI, otherwise negative  Cardiovascular:       Per HPI, otherwise negative  Gastrointestinal: Negative for vomiting.  Endocrine:       Negative aside from HPI  Genitourinary:       Neg aside from HPI   Musculoskeletal:       Per HPI, otherwise negative  Skin: Positive for rash.  Allergic/Immunologic: Negative for immunocompromised state.  Neurological: Negative for syncope.  Psychiatric/Behavioral: The patient is nervous/anxious.      Physical Exam Updated Vital Signs BP (!) 147/84 (BP Location: Right Arm)  Pulse 78   Temp 98 F (36.7 C) (Oral)   Resp 20   SpO2 100%   Physical Exam  Constitutional: She is oriented to person, place, and time. She appears well-developed and well-nourished. No distress.  HENT:  Head: Normocephalic and atraumatic.  Eyes: Conjunctivae and EOM are normal.  Cardiovascular: Normal rate, regular rhythm and intact distal pulses.  Pulmonary/Chest: No respiratory distress.  Abdominal: She exhibits no distension.  Musculoskeletal: She exhibits no edema.  Neurological: She is alert and oriented to person, place, and time. No cranial nerve deficit.  Skin: Skin is warm and dry.     Psychiatric: She has a normal mood and affect.  Nursing note and vitals reviewed.    ED Treatments / Results   Procedures Procedures (including critical  care time)  Medications Ordered in ED Medications  acyclovir (ZOVIRAX) 200 MG capsule 800 mg (has no administration in time range)  predniSONE (DELTASONE) tablet 60 mg (has no administration in time range)     Initial Impression / Assessment and Plan / ED Course  I have reviewed the triage vital signs and the nursing notes.  Pertinent labs & imaging results that were available during my care of the patient were reviewed by me and considered in my medical decision making (see chart for details).  Otherwise well-appearing female presents with dermatomal rash, burning, sharp, painful. Patient is awake and alert, hemodynamically unremarkable, no evidence for disseminated disease, there is suspicion for shingles. Vision started on appropriate meds, will follow-up with primary care.  Final Clinical Impressions(s) / ED Diagnoses   Final diagnoses:  Herpes zoster without complication    ED Discharge Orders         Ordered    predniSONE (DELTASONE) 20 MG tablet  Daily with breakfast     11/15/17 1341    acyclovir (ZOVIRAX) 400 MG tablet  5 times daily     11/15/17 1341           Gerhard Munch, MD 11/15/17 1348

## 2017-11-15 NOTE — ED Triage Notes (Signed)
Pt in c/o rash to her left arm, said she started with pain to her shoulder and arm, developed rash this morning

## 2017-12-15 ENCOUNTER — Encounter (HOSPITAL_COMMUNITY): Payer: Self-pay

## 2017-12-15 ENCOUNTER — Emergency Department (HOSPITAL_COMMUNITY)
Admission: EM | Admit: 2017-12-15 | Discharge: 2017-12-15 | Disposition: A | Discharge status: Home / Self Care | Payer: Medicaid Other | Attending: Emergency Medicine | Admitting: Emergency Medicine

## 2017-12-15 ENCOUNTER — Other Ambulatory Visit: Payer: Self-pay

## 2017-12-15 DIAGNOSIS — R110 Nausea: Secondary | ICD-10-CM | POA: Diagnosis present

## 2017-12-15 DIAGNOSIS — Z79899 Other long term (current) drug therapy: Secondary | ICD-10-CM | POA: Diagnosis not present

## 2017-12-15 DIAGNOSIS — R42 Dizziness and giddiness: Secondary | ICD-10-CM | POA: Insufficient documentation

## 2017-12-15 LAB — CBC WITH DIFFERENTIAL/PLATELET
Abs Immature Granulocytes: 0.02 10*3/uL (ref 0.00–0.07)
BASOS ABS: 0.1 10*3/uL (ref 0.0–0.1)
Basophils Relative: 1 %
EOS ABS: 0.1 10*3/uL (ref 0.0–0.5)
Eosinophils Relative: 2 %
HEMATOCRIT: 38.1 % (ref 36.0–46.0)
Hemoglobin: 12 g/dL (ref 12.0–15.0)
IMMATURE GRANULOCYTES: 0 %
LYMPHS ABS: 1.7 10*3/uL (ref 0.7–4.0)
Lymphocytes Relative: 23 %
MCH: 29.8 pg (ref 26.0–34.0)
MCHC: 31.5 g/dL (ref 30.0–36.0)
MCV: 94.5 fL (ref 80.0–100.0)
Monocytes Absolute: 0.5 10*3/uL (ref 0.1–1.0)
Monocytes Relative: 6 %
NEUTROS PCT: 68 %
NRBC: 0 % (ref 0.0–0.2)
Neutro Abs: 5.1 10*3/uL (ref 1.7–7.7)
PLATELETS: 277 10*3/uL (ref 150–400)
RBC: 4.03 MIL/uL (ref 3.87–5.11)
RDW: 13.1 % (ref 11.5–15.5)
WBC: 7.4 10*3/uL (ref 4.0–10.5)

## 2017-12-15 LAB — COMPREHENSIVE METABOLIC PANEL
ALBUMIN: 4 g/dL (ref 3.5–5.0)
ALT: 14 U/L (ref 0–44)
AST: 18 U/L (ref 15–41)
Alkaline Phosphatase: 73 U/L (ref 38–126)
Anion gap: 8 (ref 5–15)
BILIRUBIN TOTAL: 0.6 mg/dL (ref 0.3–1.2)
BUN: 9 mg/dL (ref 8–23)
CHLORIDE: 104 mmol/L (ref 98–111)
CO2: 26 mmol/L (ref 22–32)
Calcium: 9.4 mg/dL (ref 8.9–10.3)
Creatinine, Ser: 0.71 mg/dL (ref 0.44–1.00)
GFR calc Af Amer: >60 mL/min (ref >60)
GFR calc non Af Amer: >60 mL/min (ref >60)
GLUCOSE: 126 mg/dL — AB (ref 70–99)
POTASSIUM: 3.4 mmol/L — AB (ref 3.5–5.1)
Sodium: 138 mmol/L (ref 135–145)
TOTAL PROTEIN: 6.8 g/dL (ref 6.5–8.1)

## 2017-12-15 LAB — URINALYSIS, ROUTINE W REFLEX MICROSCOPIC
BILIRUBIN URINE: NEGATIVE
GLUCOSE, UA: NEGATIVE mg/dL
HGB URINE DIPSTICK: NEGATIVE
Ketones, ur: NEGATIVE mg/dL
Leukocytes, UA: NEGATIVE
Nitrite: NEGATIVE
PROTEIN: NEGATIVE mg/dL
SPECIFIC GRAVITY, URINE: 1.004 — AB (ref 1.005–1.030)
pH: 6 (ref 5.0–8.0)

## 2017-12-15 MED ORDER — MECLIZINE HCL 25 MG PO TABS
25.0000 mg | ORAL_TABLET | Freq: Once | ORAL | Status: AC
  Administered 2017-12-15: 25 mg via ORAL
  Filled 2017-12-15: qty 1

## 2017-12-15 MED ORDER — MECLIZINE HCL 25 MG PO TABS: 25.0000 mg | ORAL_TABLET | Freq: Three times a day (TID) | ORAL | 0 refills | Status: DC | PRN

## 2017-12-15 NOTE — ED Provider Notes (Signed)
[facility]-EMERGENCY DEPT Provider Note   CSN: [id number] Arrival date & time: 12/15/17  1518     History   Chief Complaint No chief complaint on file.   HPI Ann Scott is a 62 y.o. female.  HPI Patient presents with room spinning sensation and nausea which started yesterday.  States she experienced the episode when she woke up but it subsided.  States that today symptoms began again this morning but have been persistent throughout the day.  Is feeling off balance when she walks.  Denies any focal weakness or numbness.  No visual or speech changes. Past Medical History:  Diagnosis Date  . Anxiety   . Arthritis   . Depression   . Scoliosis     There are no active problems to display for this patient.   Past Surgical History:  Procedure Laterality Date  . ENDOMETRIAL ABLATION       OB History   None      Home Medications    Prior to Admission medications   Medication Sig Start Date End Date Taking? Authorizing Provider  meclizine (ANTIVERT) 25 MG tablet Take 1 tablet (25 mg total) by mouth 3 (three) times daily as needed for dizziness. 12/15/17   Loren RacerYelverton, Gretel Cantu, MD  naproxen (NAPROSYN) 375 MG tablet Take 1 tablet (375 mg total) by mouth 2 (two) times daily. Patient not taking: Reported on 12/15/2017 11/09/17   Georgetta HaberBurky, Natalie B, NP    Family History Family History  Problem Relation Age of Onset  . Heart disease Mother   . Cancer Father     Social History Social History   Tobacco Use  . Smoking status: Never Smoker  . Smokeless tobacco: Never Used  Substance Use Topics  . Alcohol use: No  . Drug use: No     Allergies   Effexor [venlafaxine]; Prozac [fluoxetine]; and Sulfa antibiotics   Review of Systems Review of Systems  Constitutional: Negative for chills and fever.  HENT: Negative for sore throat and trouble swallowing.   Eyes: Negative for visual disturbance.  Respiratory: Negative for cough and shortness of breath.    Cardiovascular: Negative for chest pain and palpitations.  Gastrointestinal: Positive for nausea. Negative for abdominal pain, constipation, diarrhea and vomiting.  Genitourinary: Negative for dysuria, flank pain, frequency and hematuria.  Musculoskeletal: Negative for back pain, myalgias, neck pain and neck stiffness.  Skin: Negative for rash and wound.  Neurological: Positive for dizziness and headaches. Negative for speech difficulty, weakness and numbness.  Psychiatric/Behavioral: Positive for dysphoric mood. Negative for suicidal ideas. The patient is nervous/anxious.   All other systems reviewed and are negative.    Physical Exam Updated Vital Signs BP 120/71 (BP Location: Left Arm)   Pulse 64   Temp (!) 97.5 F (36.4 C) (Oral)   Resp 18   Wt 61.2 kg   SpO2 95%   BMI 23.16 kg/m   Physical Exam  Constitutional: She is oriented to person, place, and time. She appears well-developed and well-nourished.  HENT:  Head: Normocephalic and atraumatic.  Mouth/Throat: Oropharynx is clear and moist. No oropharyngeal exudate.  Eyes: Pupils are equal, round, and reactive to light. EOM are normal.  Fatigable horizontal nystagmus  Neck: Normal range of motion. Neck supple.  No meningismus  Cardiovascular: Normal rate and regular rhythm. Exam reveals no gallop and no friction rub.  No murmur heard. Pulmonary/Chest: Effort normal and breath sounds normal. No stridor. No respiratory distress. She has no wheezes. She has no  rales. She exhibits no tenderness.  Abdominal: Soft. Bowel sounds are normal. There is no tenderness. There is no rebound and no guarding.  Musculoskeletal: Normal range of motion. She exhibits no edema or tenderness.  Neurological: She is alert and oriented to person, place, and time.  Patient is alert and oriented x3 with clear, goal oriented speech. Patient has 5/5 motor in all extremities. Sensation is intact to light touch. Bilateral finger-to-nose is normal with  no signs of dysmetria. Patient has a normal gait and walks without assistance.  Skin: Skin is warm and dry. Capillary refill takes less than 2 seconds. No rash noted. No erythema.  Psychiatric: Her behavior is normal.  Mildly anxious appearing  Nursing note and vitals reviewed.    ED Treatments / Results  Labs (all labs ordered are listed, but only abnormal results are displayed) Labs Reviewed  COMPREHENSIVE METABOLIC PANEL - Abnormal; Notable for the following components:      Result Value   Potassium 3.4 (*)    Glucose, Bld 126 (*)    All other components within normal limits  URINALYSIS, ROUTINE W REFLEX MICROSCOPIC - Abnormal; Notable for the following components:   Color, Urine STRAW (*)    Specific Gravity, Urine 1.004 (*)    All other components within normal limits  CBC WITH DIFFERENTIAL/PLATELET    EKG EKG Interpretation  Date/Time:  Friday December 15 2017 17:33:46 EST Ventricular Rate:  66 PR Interval:    QRS Duration: 104 QT Interval:  417 QTC Calculation: 437 R Axis:   74 Text Interpretation:  Sinus rhythm Consider left atrial enlargement RSR' in V1 or V2, probably normal variant Confirmed by Loren Racer ([id number]) on 12/15/2017 11:24:24 PM   Radiology No results found.  Procedures Procedures (including critical care time)  Medications Ordered in ED Medications  meclizine (ANTIVERT) tablet 25 mg (25 mg Oral Given 12/15/17 1724)     Initial Impression / Assessment and Plan / ED Course  I have reviewed the triage vital signs and the nursing notes.  Pertinent labs & imaging results that were available during my care of the patient were reviewed by me and considered in my medical decision making (see chart for details).     Patient is ambulating without difficulty.  Symptoms are improved.  Normal neurologic exam.  Likely peripheral vertigo.  Low suspicion for central cause.  Will start on meclizine.  Advised to take Zyrtec or Claritin as needed for  nasal congestion.  Follow-up with neurology if symptoms persist.  Return precautions given.  Final Clinical Impressions(s) / ED Diagnoses   Final diagnoses:  Vertigo    ED Discharge Orders         Ordered    meclizine (ANTIVERT) 25 MG tablet  3 times daily PRN     12/15/17 2031           Loren Racer, MD 12/15/17 2324

## 2017-12-15 NOTE — ED Triage Notes (Addendum)
Pt states that she woke up dizzy yesterday and it passed. Pt states that today the dizziness won't subside. Pt states that dizziness gets worse with positional changes, but then settles down. Pt states that her vertigo today is the worst that it;s been. Pt also states that she has a right temple headache as well and left neck pain. Pt states that everything seems to be moving with her eyes opened, but subsides with closing eyes. Pt describes sensation as being on a boat. Pt states that she walked here from her apartment.

## 2017-12-15 NOTE — ED Notes (Signed)
Pt walked in the hall and back to her room.  She had a steady gait but she felt like she was still a little dizzy.  She does not feel like she usually does when walking.  She wants someone to check if she has fluid inside of her ear that might cause vertigo.

## 2017-12-25 ENCOUNTER — Telehealth: Payer: Self-pay | Admitting: Neurology

## 2017-12-25 ENCOUNTER — Ambulatory Visit: Payer: Medicaid Other | Admitting: Neurology

## 2017-12-25 ENCOUNTER — Encounter: Payer: Self-pay | Admitting: Neurology

## 2017-12-25 DIAGNOSIS — H81399 Other peripheral vertigo, unspecified ear: Secondary | ICD-10-CM

## 2017-12-25 NOTE — Progress Notes (Signed)
Subjective:    Patient ID: Ann Scott is a 62 y.o. female.  HPI     Huston Foley, MD, PhD [facility] [address], [address] [address] [place], [place] [address]  I saw patient, Ann Scott, as a referral from the ER for vertigo. The patient is unaccompanied today. She is a 62 year old right-handed woman with an underlying medical history of anxiety, depression, scoliosis, and arthritis, who presented to the emergency room at [facility] on 12/15/2017 with new onset vertigo symptoms with a sense of room spinning and associated nausea. I reviewed the emergency room records. She was treated symptomatically with meclizine. She was advised to use over-the-counter allergy medicine for nasal congestion.  She had a prior head CT without contrast on 04/30/2011 after a fall in the bathtub and I reviewed the results: IMPRESSION: Normal CT of the head without contrast. She is single and lives alone, rents a room in a rooming house. She is a nonsmoker and does not utilize alcohol, drinks caffeine in the form of coffee or tea or soda about 4 times out of the week. She tried the meclizine and reports that it was initially helpful but it was also making her quite sleepy so she stopped using it. She sleeps on the floor on an air mattress. She reports not having been able to eat or drink as well recently, due to lack of funds. She was recently treated for shingles, affecting the left side of her chest, and has also seen orthopedics for back pain and scoliosis.  She feels that her vertigo symptoms are slowly improving.  Her Past Medical History Is Significant For: Past Medical History:  Diagnosis Date  . Anxiety   . Arthritis   . Depression   . Scoliosis     Her Past Surgical History Is Significant For: Past Surgical History:  Procedure Laterality Date  . ENDOMETRIAL ABLATION      Her Family History Is Significant For: Family History  Problem Relation Age of  Onset  . Heart disease Mother   . Cancer Father     Her Social History Is Significant For: Social History   Socioeconomic History  . Marital status: Single    Spouse name: Not on file  . Number of children: Not on file  . Years of education: Not on file  . Highest education level: Not on file  Occupational History  . Not on file  Social Needs  . Financial resource strain: Not on file  . Food insecurity:    Worry: Not on file    Inability: Not on file  . Transportation needs:    Medical: Not on file    Non-medical: Not on file  Tobacco Use  . Smoking status: Never Smoker  . Smokeless tobacco: Never Used  Substance and Sexual Activity  . Alcohol use: No  . Drug use: No  . Sexual activity: Never    Birth control/protection: None  Lifestyle  . Physical activity:    Days per week: Not on file    Minutes per session: Not on file  . Stress: Not on file  Relationships  . Social connections:    Talks on phone: Not on file    Gets together: Not on file    Attends religious service: Not on file    Active member of club or organization: Not on file    Attends meetings of clubs or organizations: Not on file    Relationship status: Not on  file  Other Topics Concern  . Not on file  Social History Narrative  . Not on file    Her Allergies Are:  Allergies  Allergen Reactions  . Effexor [Venlafaxine] Anxiety, Palpitations and Other (See Comments)    Slurred speech  . Prozac [Fluoxetine] Anxiety and Other (See Comments)    Memory loss  . Sulfa Antibiotics Itching and Rash  :   Her Current Medications Are:  Outpatient Encounter Medications as of 12/25/2017  Medication Sig  . meclizine (ANTIVERT) 25 MG tablet Take 1 tablet (25 mg total) by mouth 3 (three) times daily as needed for dizziness. (Patient not taking: Reported on 12/25/2017)  . [DISCONTINUED] naproxen (NAPROSYN) 375 MG tablet Take 1 tablet (375 mg total) by mouth 2 (two) times daily. (Patient not taking:  Reported on 12/15/2017)   No facility-administered encounter medications on file as of 12/25/2017.   :   Review of Systems:  Out of a complete 14 point review of systems, all are reviewed and negative with the exception of these symptoms as listed below:  Review of Systems  Neurological:       Pt presents today to discuss her dizziness. Pt's dizziness is improving. Pt is wondering if her dizziness could be related to be malnourished. Pt does not have stable living conditions and has bene losing weight because she cannot buy food.    Objective:  Neurological Exam  Physical Exam Physical Examination:   Vitals:   12/25/17 1328  BP: 131/81  Pulse: 61   On orthostatic testing: Lying blood pressure and pulse 125/74 with a pulse of 70, sitting 131/81 with a pulse of 61, standing 116/77 with a pulse of 83.  General Examination: The patient is a very pleasant 62 y.o. female in no acute distress. She appears well-developed and well-nourished and well groomed.   HEENT: Normocephalic, atraumatic, pupils are equal, round and reactive to light and accommodation. Funduscopy is fairly normal. She reports needing glasses.  Extraocular tracking is good without limitation to gaze excursion or nystagmus noted. Normal smooth pursuit is noted. Hearing is grossly intact. Tympanic membranes are partially obscured on the R with cerumen and some wax noted on the L. Face is symmetric with normal facial animation and normal facial sensation. Speech is clear with no dysarthria noted. There is no hypophonia. There is no lip, neck/head, jaw or voice tremor. Neck is supple with full range of passive and active motion. There are no carotid bruits on auscultation. Oropharynx exam reveals: moderate mouth dryness, adequate dental hygiene. Tongue protrudes centrally and palate elevates symmetrically.   Chest: Clear to auscultation without wheezing, rhonchi or crackles noted.  Heart: S1+S2+0, regular and normal without  murmurs, rubs or gallops noted.   Abdomen: Soft, non-tender and non-distended with normal bowel sounds appreciated on auscultation.  Extremities: There is  pitting edema in the distal lower extremities bilaterally.  Skin: Warm and dry without trophic changes noted.  Musculoskeletal: exam reveals no obvious joint deformities, tenderness or joint swelling or erythema.   Neurologically:  Mental status: The patient is awake, alert and oriented in all 4 spheres. Her immediate and remote memory, attention, language skills and fund of knowledge are appropriate. There is no evidence of aphasia, agnosia, apraxia or anomia. Speech is clear with normal prosody and enunciation. Thought process is linear. Mood is normal and affect is normal.  Cranial nerves II - XII are as described above under HEENT exam. In addition: shoulder shrug is normal with equal shoulder height noted.  Motor exam: Normal bulk, strength and tone is noted. There is no drift, tremor or rebound. Romberg is negative. Reflexes are 1+ throughout.   Cerebellar testing: No dysmetria or intention tremor on finger to nose testing. Heel to shin is unremarkable bilaterally. There is no truncal or gait ataxia.  Sensory exam: intact to light touch in the upper and lower extremities.  Gait, station and balance: She stands easily. No veering to one side is noted. No leaning to one side is noted. Posture is age-appropriate and stance is narrow based. Gait shows normal stride length and normal pace. No problems turning are noted. Tandem walk is unremarkable.   Assessment and Plan:   In summary, Ann Scott is a very pleasant 62 y.o.-year old female with an underlying medical history of anxiety, depression, scoliosis, and arthritis, who presents for evaluation of her vertigo, referred by the emergency room recently. Thankfully, her symptoms are improving, neurological exam is nonfocal at this time. Nevertheless, we could certainly proceed with a brain  MRI to rule out any central/structural cause of her symptoms. I will request insurance authorization for a brain MRI with and without contrast. She is reminded to change positions slowly and stay well-hydrated with water, 6-8 cups of water are recommended. She had some symptomatic relief with meclizine but found it to be too sedating. She can stay off of her meclizine at this time. She has some cerumen build up in the right more than left ear, she is advised to get over-the-counter eardrops for reducing earwax buildup. Furthermore, she reports intermittent issues with visual acuity, she is advised to get her eyes checked out, she may need prescription eyeglasses. She is advised to follow-up with her primary care physician, we will keep her posted as to her brain MRI results when they are available. I answered all her questions today and the patient was in agreement.   Huston Foley, MD, PhD

## 2017-12-25 NOTE — Patient Instructions (Addendum)
Unfortunately, dizziness is a very common complaint but is often not due to a primary neurological reason or single underlying medical problem. Often, there a combination of factors, that result in dizziness. This includes blood pressure fluctuations, medication side effects, blood sugar fluctuations, stress, vertigo, poor sleep with sleep deprivation, dehydration, and electrolyte disturbance or other metabolic and endocrinological reasons, meaning hormone related problems such as thyroid dysfunction.  We will investigate things further with a brain MRI, but your neurological exam is normal.   We will call you with the test results.   Please follow up with your primary care doctor, hydrate well with water, 6 to 8 cups.   Please look into getting ear drops for decreasing ear wax and get your eyes checked.

## 2017-12-25 NOTE — Telephone Encounter (Signed)
Medicaid order sent to GI lvm for pt to be aware. lvm for pt to be aware of this and that I gave her GI phone number of [phone] and to give them a call if she has not heard in the next 2-3 business days.

## 2018-10-18 ENCOUNTER — Ambulatory Visit (HOSPITAL_COMMUNITY)
Admission: EM | Admit: 2018-10-18 | Discharge: 2018-10-18 | Disposition: A | Discharge status: Home / Self Care | Payer: Medicaid Other | Attending: Family Medicine | Admitting: Family Medicine

## 2018-10-18 ENCOUNTER — Other Ambulatory Visit: Payer: Self-pay

## 2018-10-18 ENCOUNTER — Encounter (HOSPITAL_COMMUNITY): Payer: Self-pay | Admitting: Emergency Medicine

## 2018-10-18 DIAGNOSIS — N3001 Acute cystitis with hematuria: Secondary | ICD-10-CM | POA: Diagnosis not present

## 2018-10-18 LAB — POCT URINALYSIS DIP (DEVICE)
Bilirubin Urine: NEGATIVE
Glucose, UA: NEGATIVE mg/dL
Ketones, ur: NEGATIVE mg/dL
Nitrite: NEGATIVE
Protein, ur: 100 mg/dL — AB
Specific Gravity, Urine: 1.025 (ref 1.005–1.030)
Urobilinogen, UA: 0.2 mg/dL (ref 0.0–1.0)
pH: 5.5 (ref 5.0–8.0)

## 2018-10-18 MED ORDER — CIPROFLOXACIN HCL 500 MG PO TABS: 500.0000 mg | ORAL_TABLET | Freq: Two times a day (BID) | ORAL | 0 refills | Status: AC

## 2018-10-18 MED ORDER — PHENAZOPYRIDINE HCL 200 MG PO TABS: 200.0000 mg | ORAL_TABLET | Freq: Three times a day (TID) | ORAL | 0 refills | Status: DC

## 2018-10-18 NOTE — ED Triage Notes (Signed)
Pt sts UTI sx x several days  

## 2018-10-18 NOTE — Discharge Instructions (Addendum)
Take medications as prescribed. Drink plenty of fluids. Follow-up with your primary care provider as needed.   Take care and stay safe!  Ann Scott

## 2018-10-18 NOTE — ED Provider Notes (Signed)
[facility]    CSN: [id number] Arrival date & time: 10/18/18  1325      History   Chief Complaint Chief Complaint  Patient presents with   Urinary Tract Infection    HPI Ann Scott is a 63 y.o. female.   Subjective:  Ann Scott is a 63 y.o. female who complains of dysuria, foul smelling urine, frequency, suprapubic pressure and urgency for 4 days.  Patient also complains of back pain. Patient denies fever, stomach ache, nausea or vomiting. Patient does not have a history of recurrent UTI.  Patient does not have a history of pyelonephritis.  The following portions of the patient's history were reviewed and updated as appropriate: allergies, current medications, past family history, past medical history, past social history, past surgical history and problem list.       Past Medical History:  Diagnosis Date   Anxiety    Arthritis    Depression    Scoliosis     There are no active problems to display for this patient.   Past Surgical History:  Procedure Laterality Date   ENDOMETRIAL ABLATION      OB History   No obstetric history on file.      Home Medications    Prior to Admission medications   Medication Sig Start Date End Date Taking? Authorizing Provider  ciprofloxacin (CIPRO) 500 MG tablet Take 1 tablet (500 mg total) by mouth every 12 (twelve) hours for 7 days. 10/18/18 10/25/18  Lurline Idol, FNP  phenazopyridine (PYRIDIUM) 200 MG tablet Take 1 tablet (200 mg total) by mouth 3 (three) times daily. 10/18/18   Lurline Idol, FNP    Family History Family History  Problem Relation Age of Onset   Heart disease Mother    Cancer Father     Social History Social History   Tobacco Use   Smoking status: Never Smoker   Smokeless tobacco: Never Used  Substance Use Topics   Alcohol use: No   Drug use: No     Allergies   Effexor [venlafaxine], Prozac [fluoxetine], and Sulfa antibiotics   Review of  Systems Review of Systems  Constitutional: Positive for fatigue. Negative for fever.  Gastrointestinal: Negative for nausea and vomiting.  Genitourinary: Positive for dysuria, frequency and urgency. Negative for flank pain and vaginal discharge.  Musculoskeletal: Positive for back pain.  All other systems reviewed and are negative.    Physical Exam Triage Vital Signs ED Triage Vitals [10/18/18 1358]  Enc Vitals Group     BP 125/66     Pulse Rate 71     Resp 18     Temp 97.9 F (36.6 C)     Temp Source Oral     SpO2 98 %     Weight      Height      Head Circumference      Peak Flow      Pain Score 0     Pain Loc      Pain Edu?      Excl. in GC?    No data found.  Updated Vital Signs BP 125/66 (BP Location: Right Arm)    Pulse 71    Temp 97.9 F (36.6 C) (Oral)    Resp 18    SpO2 98%   Visual Acuity Right Eye Distance:   Left Eye Distance:   Bilateral Distance:    Right Eye Near:   Left Eye Near:    Bilateral Near:  Physical Exam Vitals signs reviewed.  Constitutional:      Appearance: Normal appearance.  Neck:     Musculoskeletal: Normal range of motion and neck supple.  Cardiovascular:     Rate and Rhythm: Normal rate and regular rhythm.  Pulmonary:     Effort: Pulmonary effort is normal.     Breath sounds: Normal breath sounds.  Abdominal:     Palpations: Abdomen is soft.     Tenderness: There is no abdominal tenderness.  Musculoskeletal: Normal range of motion.  Skin:    General: Skin is warm and dry.  Neurological:     General: No focal deficit present.     Mental Status: She is alert and oriented to person, place, and time.      UC Treatments / Results  Labs (all labs ordered are listed, but only abnormal results are displayed) Labs Reviewed  POCT URINALYSIS DIP (DEVICE) - Abnormal; Notable for the following components:      Result Value   Hgb urine dipstick LARGE (*)    Protein, ur 100 (*)    Leukocytes,Ua SMALL (*)    All other  components within normal limits  URINE CULTURE    EKG   Radiology No results found.  Procedures Procedures (including critical care time)  Medications Ordered in UC Medications - No data to display  Initial Impression / Assessment and Plan / UC Course  I have reviewed the triage vital signs and the nursing notes.  Pertinent labs & imaging results that were available during my care of the patient were reviewed by me and considered in my medical decision making (see chart for details).    63 yo female presenting with a four-day history of foul smelling urine, frequency, suprapubic pressure, urgency and back pain. Urine dipstick shows large for hemoglobin, small for leukocyte esterase and 100 for protein. Patient AAOx3. Afebrile. Nontoxic appearing.   Plan:  1. Medications: ciprofloxacin and Pyridium  2. Maintain adequate hydration 3. Follow up with PCP if symptoms not improving and prn.  Today's evaluation has revealed no signs of a dangerous process. Discussed diagnosis with patient and/or guardian. Patient and/or guardian aware of their diagnosis, possible red flag symptoms to watch out for and need for close follow up. Patient and/or guardian understands verbal and written discharge instructions. Patient and/or guardian comfortable with plan and disposition.  Patient and/or guardian has a clear mental status at this time, good insight into illness (after discussion and teaching) and has clear judgment to make decisions regarding their care  This care was provided during an unprecedented National Emergency due to the Novel Coronavirus (COVID-19) pandemic. COVID-19 infections and transmission risks place heavy strains on healthcare resources.  As this pandemic evolves, our facility, providers, and staff strive to respond fluidly, to remain operational, and to provide care relative to available resources and information. Outcomes are unpredictable and treatments are without well-defined  guidelines. Further, the impact of COVID-19 on all aspects of urgent care, including the impact to patients seeking care for reasons other than COVID-19, is unavoidable during this national emergency. At this time of the global pandemic, management of patients has significantly changed, even for non-COVID positive patients given high local and regional COVID volumes at this time requiring high healthcare system and resource utilization. The standard of care for management of both COVID suspected and non-COVID suspected patients continues to change rapidly at the local, regional, national, and global levels. This patient was worked up and treated to the best available  but ever changing evidence and resources available at this current time.   Documentation was completed with the aid of voice recognition software. Transcription may contain typographical errors.   Final Clinical Impressions(s) / UC Diagnoses   Final diagnoses:  Acute cystitis with hematuria     Discharge Instructions     Take medications as prescribed. Drink plenty of fluids. Follow-up with your primary care provider as needed.   Take care and stay safe!  [facility]     ED Prescriptions    Medication Sig Dispense Auth. Provider   ciprofloxacin (CIPRO) 500 MG tablet Take 1 tablet (500 mg total) by mouth every 12 (twelve) hours for 7 days. 14 tablet Enrique Sack, FNP   phenazopyridine (PYRIDIUM) 200 MG tablet Take 1 tablet (200 mg total) by mouth 3 (three) times daily. 6 tablet Enrique Sack, FNP     PDMP not reviewed this encounter.   Enrique Sack, Elaine 10/18/18 [phone]

## 2018-10-20 LAB — URINE CULTURE
Culture: 80000 — AB
Special Requests: NORMAL

## 2018-10-22 ENCOUNTER — Telehealth (HOSPITAL_COMMUNITY): Payer: Self-pay | Admitting: Emergency Medicine

## 2018-10-22 NOTE — Telephone Encounter (Signed)
Urine culture was positive for e coli and was given  cipro at urgent care visit. Attempted to reach patient. No answer at this time.   

## 2019-03-15 ENCOUNTER — Ambulatory Visit (HOSPITAL_COMMUNITY)
Admission: EM | Admit: 2019-03-15 | Discharge: 2019-03-15 | Disposition: A | Discharge status: Home / Self Care | Payer: Medicaid Other | Attending: Family Medicine | Admitting: Family Medicine

## 2019-03-15 ENCOUNTER — Encounter (HOSPITAL_COMMUNITY): Payer: Self-pay | Admitting: Emergency Medicine

## 2019-03-15 ENCOUNTER — Other Ambulatory Visit: Payer: Self-pay

## 2019-03-15 DIAGNOSIS — M79601 Pain in right arm: Secondary | ICD-10-CM | POA: Diagnosis not present

## 2019-03-15 NOTE — ED Provider Notes (Signed)
[facility]    CSN: [id number] Arrival date & time: 03/15/19  1718      History   Chief Complaint Chief Complaint  Patient presents with  . Arm Pain    HPI Ann Scott is a 64 y.o. female.   HPI  Soreness in arm after covid shot yesterday Right bicep sore Thinks hand was swollen earlier but it is normal now  Past Medical History:  Diagnosis Date  . Anxiety   . Arthritis   . Depression   . Scoliosis     There are no problems to display for this patient.   Past Surgical History:  Procedure Laterality Date  . ENDOMETRIAL ABLATION      OB History   No obstetric history on file.      Home Medications    Prior to Admission medications   Medication Sig Start Date End Date Taking? Authorizing Provider  phenazopyridine (PYRIDIUM) 200 MG tablet Take 1 tablet (200 mg total) by mouth 3 (three) times daily. Patient not taking: Reported on 03/15/2019 10/18/18   Lurline Idol, FNP    Family History Family History  Problem Relation Age of Onset  . Heart disease Mother   . Cancer Father     Social History Social History   Tobacco Use  . Smoking status: Never Smoker  . Smokeless tobacco: Never Used  Substance Use Topics  . Alcohol use: No  . Drug use: No     Allergies   Effexor [venlafaxine], Prozac [fluoxetine], and Sulfa antibiotics   Review of Systems Review of Systems Soreness right bicep  Physical Exam Triage Vital Signs ED Triage Vitals  Enc Vitals Group     BP 03/15/19 1752 (!) 154/80     Pulse Rate 03/15/19 1752 82     Resp 03/15/19 1752 18     Temp 03/15/19 1752 98.2 F (36.8 C)     Temp Source 03/15/19 1752 Oral     SpO2 03/15/19 1752 98 %     Weight --      Height --      Head Circumference --      Peak Flow --      Pain Score 03/15/19 1753 2     Pain Loc --      Pain Edu? --      Excl. in GC? --    No data found.  Updated Vital Signs BP (!) 154/80 (BP Location: Right Arm)   Pulse 82   Temp 98.2 F (36.8  C) (Oral)   Resp 18   SpO2 98%      Physical Exam Constitutional:      General: She is not in acute distress.    Appearance: She is well-developed.     Comments: Talkative, nervous  HENT:     Head: Normocephalic and atraumatic.  Eyes:     Conjunctiva/sclera: Conjunctivae normal.     Pupils: Pupils are equal, round, and reactive to light.  Cardiovascular:     Rate and Rhythm: Normal rate.  Pulmonary:     Effort: Pulmonary effort is normal. No respiratory distress.  Abdominal:     General: There is no distension.     Palpations: Abdomen is soft.  Musculoskeletal:        General: Normal range of motion.     Cervical back: Normal range of motion.     Comments: Right arm is examined.  The bicep area appears normal.  There is no redness.  Area of  puncture, area Band-Aid is not visible.  There is no palpable induration.  The rest of the right upper extremity exam is normal.  Skin is very dry and chapped.  There is no swelling.  Skin:    General: Skin is warm and dry.  Neurological:     Mental Status: She is alert.      UC Treatments / Results  Labs (all labs ordered are listed, but only abnormal results are displayed) Labs Reviewed - No data to display  EKG   Radiology No results found.  Procedures Procedures (including critical care time)  Medications Ordered in UC Medications - No data to display  Initial Impression / Assessment and Plan / UC Course  I have reviewed the triage vital signs and the nursing notes.  Pertinent labs & imaging results that were available during my care of the patient were reviewed by me and considered in my medical decision making (see chart for details).     Shot pain, normal Final Clinical Impressions(s) / UC Diagnoses   Final diagnoses:  Right arm pain     Discharge Instructions     Return as needed   ED Prescriptions    None     PDMP not reviewed this encounter.   Raylene Everts, MD 03/15/19 [phone]

## 2019-03-15 NOTE — ED Triage Notes (Addendum)
Pt here for right arm pain and hand swelling after having covid vaccine; pt sts she walked 2 miles each way to get the vaccine

## 2019-03-15 NOTE — Discharge Instructions (Addendum)
Return as needed

## 2020-12-23 ENCOUNTER — Ambulatory Visit (HOSPITAL_COMMUNITY)
Admission: EM | Admit: 2020-12-23 | Discharge: 2020-12-23 | Disposition: A | Discharge status: Home / Self Care | Payer: Medicare Other

## 2020-12-23 ENCOUNTER — Encounter (HOSPITAL_COMMUNITY): Payer: Self-pay

## 2020-12-23 ENCOUNTER — Other Ambulatory Visit: Payer: Self-pay

## 2020-12-23 DIAGNOSIS — M79674 Pain in right toe(s): Secondary | ICD-10-CM | POA: Diagnosis not present

## 2020-12-23 NOTE — Discharge Instructions (Signed)
Your toe is not infected.  Keep it covered while it is feeling sore.  You can use Epsom salt once a week for about 5-10 minutes.  It is a great idea to get your flu shot.  Go to [email or web address] to find a new primary care doctor.

## 2020-12-23 NOTE — ED Provider Notes (Signed)
[facility]    CSN: [id number] Arrival date & time: 12/23/20  1048      History   Chief Complaint Chief Complaint  Patient presents with   Toe Pain    HPI Ann Scott is a 65 y.o. female.   Right Great Toe Pain Reports that she was cleaning her apartment vigorously in rubber boots for 2 days  Years ago had her right great toenail removed and since then has tenderness and irritation over the area with walking a lot States that she walks frequently as she doesn't have a car Otherwise feeling well No fevers, chills, or wounds She specifically wanted to make sure her toe was not infected so that she could get a flu shot    Past Medical History:  Diagnosis Date   Anxiety    Arthritis    Depression    Scoliosis     There are no problems to display for this patient.   Past Surgical History:  Procedure Laterality Date   ENDOMETRIAL ABLATION      OB History   No obstetric history on file.      Home Medications    Prior to Admission medications   Medication Sig Start Date End Date Taking? Authorizing Provider  phenazopyridine (PYRIDIUM) 200 MG tablet Take 1 tablet (200 mg total) by mouth 3 (three) times daily. Patient not taking: Reported on 03/15/2019 10/18/18   Lurline Idol, FNP    Family History Family History  Problem Relation Age of Onset   Heart disease Mother    Cancer Father     Social History Social History   Tobacco Use   Smoking status: Never   Smokeless tobacco: Never  Substance Use Topics   Alcohol use: No   Drug use: No     Allergies   Effexor [venlafaxine], Prozac [fluoxetine], and Sulfa antibiotics   Review of Systems Review of Systems  All other systems reviewed and are negative.   Physical Exam Triage Vital Signs ED Triage Vitals  Enc Vitals Group     BP 12/23/20 1150 (!) 148/80     Pulse Rate 12/23/20 1150 64     Resp 12/23/20 1150 20     Temp 12/23/20 1150 97.9 F (36.6 C)     Temp Source 12/23/20  1150 Oral     SpO2 12/23/20 1150 100 %     Weight --      Height --      Head Circumference --      Peak Flow --      Pain Score 12/23/20 1154 8     Pain Loc --      Pain Edu? --      Excl. in GC? --    No data found.  Updated Vital Signs BP (!) 148/80 (BP Location: Right Arm)    Pulse 64    Temp 97.9 F (36.6 C) (Oral)    Resp 20    SpO2 100%   Visual Acuity Right Eye Distance:   Left Eye Distance:   Bilateral Distance:    Right Eye Near:   Left Eye Near:    Bilateral Near:     Physical Exam Constitutional:      Appearance: Normal appearance.  HENT:     Head: Normocephalic and atraumatic.  Cardiovascular:     Rate and Rhythm: Normal rate.     Pulses:          Dorsalis pedis pulses are 2+ on the right  side and 2+ on the left side.  Pulmonary:     Effort: Pulmonary effort is normal.  Musculoskeletal:       Feet:  Skin:    General: Skin is warm and dry.     Findings: No bruising, erythema or lesion.  Neurological:     General: No focal deficit present.     Mental Status: She is alert and oriented to person, place, and time.  Psychiatric:     Comments: Somewhat tangential thought process with slightly pressured speech, however thought process is logical and she has good insight      UC Treatments / Results  Labs (all labs ordered are listed, but only abnormal results are displayed) Labs Reviewed - No data to display  EKG   Radiology No results found.  Procedures Procedures (including critical care time)  Medications Ordered in UC Medications - No data to display  Initial Impression / Assessment and Plan / UC Course  I have reviewed the triage vital signs and the nursing notes.  Pertinent labs & imaging results that were available during my care of the patient were reviewed by me and considered in my medical decision making (see chart for details).     Other than missing toenail from prior procedure, patient's toe is otherwise normal appearing  and there are no signs of infection.  Likely related to increased friction and irritation from the rubber boots.  Nonadherent dressing placed with cohesive bandage to protect and cushion.  She reports that her walking tennis shoes do not cause her pain, so recommended wearing these over rubber boots, especially while tender and when walking long distances.  In regards to her flu shot, recommended obtaining today.  Also advised that she can use [email or web address] to find a new PCP.   Final Clinical Impressions(s) / UC Diagnoses   Final diagnoses:  Great toe pain, right     Discharge Instructions      Your toe is not infected.  Keep it covered while it is feeling sore.  You can use Epsom salt once a week for about 5-10 minutes.  It is a great idea to get your flu shot.  Go to [email or web address] to find a new primary care doctor.     ED Prescriptions   None    PDMP not reviewed this encounter.   Unknown Jim, DO 12/23/20 1253

## 2020-12-23 NOTE — ED Triage Notes (Signed)
Pt presents with bilateral toe pain after having some rain boots on for 24 hours while doing extensive cleaning.

## 2021-03-11 ENCOUNTER — Other Ambulatory Visit: Payer: Self-pay

## 2021-03-11 ENCOUNTER — Ambulatory Visit (HOSPITAL_COMMUNITY)
Admission: EM | Admit: 2021-03-11 | Discharge: 2021-03-11 | Disposition: A | Discharge status: Home / Self Care | Payer: Medicare Other | Attending: Physician Assistant | Admitting: Physician Assistant

## 2021-03-11 ENCOUNTER — Encounter (HOSPITAL_COMMUNITY): Payer: Self-pay

## 2021-03-11 DIAGNOSIS — N390 Urinary tract infection, site not specified: Secondary | ICD-10-CM | POA: Diagnosis present

## 2021-03-11 DIAGNOSIS — R350 Frequency of micturition: Secondary | ICD-10-CM | POA: Insufficient documentation

## 2021-03-11 DIAGNOSIS — R300 Dysuria: Secondary | ICD-10-CM | POA: Insufficient documentation

## 2021-03-11 LAB — POCT URINALYSIS DIPSTICK, ED / UC
Bilirubin Urine: NEGATIVE
Glucose, UA: NEGATIVE mg/dL
Ketones, ur: NEGATIVE mg/dL
Nitrite: NEGATIVE
Protein, ur: NEGATIVE mg/dL
Specific Gravity, Urine: 1.02 (ref 1.005–1.030)
Urobilinogen, UA: 0.2 mg/dL (ref 0.0–1.0)
pH: 6.5 (ref 5.0–8.0)

## 2021-03-11 MED ORDER — CEPHALEXIN 500 MG PO CAPS: 500.0000 mg | ORAL_CAPSULE | Freq: Three times a day (TID) | ORAL | 0 refills | Status: DC

## 2021-03-11 NOTE — Discharge Instructions (Addendum)
It appears you have a urinary tract infection.  Please start cephalexin 3 times a day.  This can make your stomach upset so please take it with food.  Make sure you are drinking plenty of fluid.  We are sending her urine off for culture.  If we need to change your antibiotic we will contact you.  Follow-up with your primary care next week for recheck to ensure that you clear the infection and that the blood noted today is gone.  If you have any worsening symptoms including severe abdominal pain, fever, nausea/vomiting, weakness you need to go to the emergency room. ?

## 2021-03-11 NOTE — ED Triage Notes (Signed)
Pt presents for dysuria for several days.  ?

## 2021-03-11 NOTE — ED Provider Notes (Signed)
?[facility] ? ? ? ?CSN: [id number] ?Arrival date & time: 03/11/21  1113 ? ? ?  ? ?History   ?Chief Complaint ?Chief Complaint  ?Patient presents with  ? Urinary Tract Infection  ? ? ?HPI ?ICES KAMERER is a 66 y.o. female.  ? ?Patient presents today with a 2-day history of dysuria.  Reports associated urinary frequency and urinary urgency.  Denies any abdominal pain, fever, nausea, vomiting, hematuria, flank pain, vaginal discomfort.  She does have a history of recurrent UTIs states current symptoms are similar to previous episodes of this condition.  Reports last treatment was approximately 2 years ago denies any recent antibiotic use.  She denies any history of nephrolithiasis, seeing a urologist, single kidney, self-catheterization, recent urogenital procedure.  She is having difficulty with daily activities due to associated urinary frequency. ? ? ?Past Medical History:  ?Diagnosis Date  ? Anxiety   ? Arthritis   ? Depression   ? Scoliosis   ? ? ?There are no problems to display for this patient. ? ? ?Past Surgical History:  ?Procedure Laterality Date  ? ENDOMETRIAL ABLATION    ? ? ?OB History   ?No obstetric history on file. ?  ? ? ? ?Home Medications   ? ?Prior to Admission medications   ?Medication Sig Start Date End Date Taking? Authorizing Provider  ?cephALEXin (KEFLEX) 500 MG capsule Take 1 capsule (500 mg total) by mouth 3 (three) times daily. 03/11/21  Yes Mistie Adney, Junie Panning K, PA-C  ?phenazopyridine (PYRIDIUM) 200 MG tablet Take 1 tablet (200 mg total) by mouth 3 (three) times daily. ?Patient not taking: Reported on 03/15/2019 10/18/18   Enrique Sack, [place]  ? ? ?Family History ?Family History  ?Problem Relation Age of Onset  ? Heart disease Mother   ? Cancer Father   ? ? ?Social History ?Social History  ? ?Tobacco Use  ? Smoking status: Never  ? Smokeless tobacco: Never  ?Substance Use Topics  ? Alcohol use: No  ? Drug use: No  ? ? ? ?Allergies   ?Effexor [venlafaxine], Prozac [fluoxetine], and Sulfa  antibiotics ? ? ?Review of Systems ?Review of Systems  ?Constitutional:  Positive for activity change. Negative for appetite change, fatigue and fever.  ?Respiratory:  Negative for cough and shortness of breath.   ?Cardiovascular:  Negative for chest pain.  ?Gastrointestinal:  Positive for abdominal pain. Negative for diarrhea, nausea and vomiting.  ?Genitourinary:  Positive for dysuria, frequency and urgency. Negative for flank pain, hematuria, vaginal bleeding, vaginal discharge and vaginal pain.  ?Neurological:  Negative for dizziness, light-headedness and headaches.  ? ? ?Physical Exam ?Triage Vital Signs ?ED Triage Vitals [03/11/21 1313]  ?Enc Vitals Group  ?   BP 128/83  ?   Pulse Rate (!) 50  ?   Resp 16  ?   Temp 98.2 ?F (36.8 ?C)  ?   Temp Source Oral  ?   SpO2 100 %  ?   Weight   ?   Height   ?   Head Circumference   ?   Peak Flow   ?   Pain Score 0  ?   Pain Loc   ?   Pain Edu?   ?   Excl. in [place]?   ? ?No data found. ? ?Updated Vital Signs ?BP 128/83 (BP Location: Left Arm)   Pulse (!) 50   Temp 98.2 ?F (36.8 ?C) (Oral)   Resp 16   SpO2 100%  ? ?Visual Acuity ?Right Eye  Distance:   ?Left Eye Distance:   ?Bilateral Distance:   ? ?Right Eye Near:   ?Left Eye Near:    ?Bilateral Near:    ? ?Physical Exam ?Vitals reviewed.  ?Constitutional:   ?   General: She is awake. She is not in acute distress. ?   Appearance: Normal appearance. She is well-developed. She is not ill-appearing.  ?   Comments: Very pleasant female appears stated age in no acute distress  ?HENT:  ?   Head: Normocephalic and atraumatic.  ?Cardiovascular:  ?   Rate and Rhythm: Normal rate and regular rhythm.  ?   Heart sounds: Normal heart sounds, S1 normal and S2 normal. No murmur heard. ?Pulmonary:  ?   Effort: Pulmonary effort is normal.  ?   Breath sounds: Normal breath sounds. No wheezing, rhonchi or rales.  ?   Comments: Clear to auscultation bilaterally  ?Abdominal:  ?   General: Bowel sounds are normal.  ?   Palpations: Abdomen is  soft.  ?   Tenderness: There is abdominal tenderness in the suprapubic area. There is no right CVA tenderness, left CVA tenderness, guarding or rebound.  ?Psychiatric:     ?   Behavior: Behavior is cooperative.  ? ? ? ?UC Treatments / Results  ?Labs ?(all labs ordered are listed, but only abnormal results are displayed) ?Labs Reviewed  ?POCT URINALYSIS DIPSTICK, ED / UC - Abnormal; Notable for the following components:  ?    Result Value  ? Hgb urine dipstick LARGE (*)   ? Leukocytes,Ua MODERATE (*)   ? All other components within normal limits  ?URINE CULTURE  ? ? ?EKG ? ? ?Radiology ?No results found. ? ?Procedures ?Procedures (including critical care time) ? ?Medications Ordered in UC ?Medications - No data to display ? ?Initial Impression / Assessment and Plan / UC Course  ?I have reviewed the triage vital signs and the nursing notes. ? ?Pertinent labs & imaging results that were available during my care of the patient were reviewed by me and considered in my medical decision making (see chart for details). ? ?  ? ?UA shows evidence of UTI with positive leukocyte esterase and hematuria.  Given clinical presentation will empirically treat with cephalexin 500 mg 3 times daily.  Urine culture was obtained we discussed potential need to change antibiotics based on susceptibilities identified on culture.  She is to rest and drink plenty of fluid.  Discussed that if she has any worsening symptoms including severe abdominal pain, fever, nausea/vomiting interfering with oral intake, flank pain, weakness she needs to be seen in the emergency room.  Strict return precautions given to which she expressed understanding.  Recommended follow-up with PCP within a week for recheck of urine to ensure resolution of hematuria noted today. ? ?Final Clinical Impressions(s) / UC Diagnoses  ? ?Final diagnoses:  ?Lower urinary tract infectious disease  ?Dysuria  ?Urinary frequency  ? ? ? ?Discharge Instructions   ? ?  ?It appears you  have a urinary tract infection.  Please start cephalexin 3 times a day.  This can make your stomach upset so please take it with food.  Make sure you are drinking plenty of fluid.  We are sending her urine off for culture.  If we need to change your antibiotic we will contact you.  Follow-up with your primary care next week for recheck to ensure that you clear the infection and that the blood noted today is gone.  If you have any  worsening symptoms including severe abdominal pain, fever, nausea/vomiting, weakness you need to go to the emergency room. ? ? ? ? ?ED Prescriptions   ? ? Medication Sig Dispense Auth. Provider  ? cephALEXin (KEFLEX) 500 MG capsule Take 1 capsule (500 mg total) by mouth 3 (three) times daily. 21 capsule Dolph Tavano K, PA-C  ? ?  ? ?PDMP not reviewed this encounter. ?  ?Terrilee Croak, PA-C ?03/11/21 1337 ? ?

## 2021-03-13 LAB — URINE CULTURE: Culture: >=100000 — AB

## 2021-12-13 ENCOUNTER — Ambulatory Visit (HOSPITAL_COMMUNITY)
Admission: EM | Admit: 2021-12-13 | Discharge: 2021-12-13 | Disposition: A | Discharge status: Home / Self Care | Payer: Medicare Other | Attending: Nurse Practitioner | Admitting: Nurse Practitioner

## 2021-12-13 ENCOUNTER — Encounter (HOSPITAL_COMMUNITY): Payer: Self-pay

## 2021-12-13 DIAGNOSIS — R32 Unspecified urinary incontinence: Secondary | ICD-10-CM

## 2021-12-13 DIAGNOSIS — R300 Dysuria: Secondary | ICD-10-CM

## 2021-12-13 LAB — POCT URINALYSIS DIPSTICK, ED / UC
Bilirubin Urine: NEGATIVE
Glucose, UA: NEGATIVE mg/dL
Ketones, ur: NEGATIVE mg/dL
Leukocytes,Ua: NEGATIVE
Nitrite: NEGATIVE
Protein, ur: NEGATIVE mg/dL
Specific Gravity, Urine: 1.02 (ref 1.005–1.030)
Urobilinogen, UA: 0.2 mg/dL (ref 0.0–1.0)
pH: 5.5 (ref 5.0–8.0)

## 2021-12-13 MED ORDER — PHENAZOPYRIDINE HCL 200 MG PO TABS: 200.0000 mg | ORAL_TABLET | Freq: Three times a day (TID) | ORAL | 0 refills | Status: AC

## 2021-12-13 NOTE — ED Provider Notes (Signed)
[facility]    CSN: [id number] Arrival date & time: 12/13/21  1518      History   Chief Complaint Chief Complaint  Patient presents with   Dysuria    HPI Ann Scott is a 66 y.o. female.   Subjective:  Ann Scott is a 66 y.o. female who complains of burning with urination, hematuria, urinary urgency, and incomplete bladder emptying for 1 week. Patient denies back pain, flank pain, fever, nausea, vomiting, stomach ache, or vaginal discharge.  Patient does have a history of recurrent UTI.  Patient does not have a history of pyelonephritis.  The following portions of the patient's history were reviewed and updated as appropriate: allergies, current medications, past family history, past medical history, past social history, past surgical history, and problem list.       Past Medical History:  Diagnosis Date   Anxiety    Arthritis    Depression    Scoliosis     There are no problems to display for this patient.   Past Surgical History:  Procedure Laterality Date   ENDOMETRIAL ABLATION      OB History   No obstetric history on file.      Home Medications    Prior to Admission medications   Medication Sig Start Date End Date Taking? Authorizing Provider  phenazopyridine (PYRIDIUM) 200 MG tablet Take 1 tablet (200 mg total) by mouth 3 (three) times daily. 12/13/21  Yes Lurline Idol, FNP    Family History Family History  Problem Relation Age of Onset   Heart disease Mother    Cancer Father     Social History Social History   Tobacco Use   Smoking status: Never   Smokeless tobacco: Never  Substance Use Topics   Alcohol use: No   Drug use: No     Allergies   Effexor [venlafaxine], Prozac [fluoxetine], and Sulfa antibiotics   Review of Systems Review of Systems  Constitutional:  Negative for fever.  Gastrointestinal:  Negative for nausea and vomiting.  Genitourinary:  Positive for dysuria, hematuria and vaginal pain.  Negative for vaginal discharge.  All other systems reviewed and are negative.    Physical Exam Triage Vital Signs ED Triage Vitals  Enc Vitals Group     BP 12/13/21 1808 (!) 158/91     Pulse Rate 12/13/21 1808 72     Resp 12/13/21 1808 12     Temp 12/13/21 1808 98.2 F (36.8 C)     Temp Source 12/13/21 1808 Oral     SpO2 12/13/21 1808 100 %     Weight --      Height --      Head Circumference --      Peak Flow --      Pain Score 12/13/21 1804 4     Pain Loc --      Pain Edu? --      Excl. in GC? --    No data found.  Updated Vital Signs BP (!) 158/91 (BP Location: Right Arm)   Pulse 72   Temp 98.2 F (36.8 C) (Oral)   Resp 12   SpO2 100%   Visual Acuity Right Eye Distance:   Left Eye Distance:   Bilateral Distance:    Right Eye Near:   Left Eye Near:    Bilateral Near:     Physical Exam Vitals reviewed. Exam conducted with a chaperone present.  Constitutional:      Appearance: Normal appearance.  HENT:  Head: Normocephalic.  Cardiovascular:     Rate and Rhythm: Normal rate and regular rhythm.  Pulmonary:     Effort: Pulmonary effort is normal.     Breath sounds: Normal breath sounds.  Abdominal:     Palpations: Abdomen is soft.     Tenderness: There is no right CVA tenderness or left CVA tenderness.  Genitourinary:    General: Normal vulva.     Pubic Area: No rash.      Labia:        Right: No rash, tenderness, lesion or injury.        Left: No rash, tenderness, lesion or injury.      Urethra: No prolapse, urethral pain, urethral swelling or urethral lesion.     Vagina: Normal. No signs of injury and foreign body. No vaginal discharge, erythema, tenderness, bleeding, lesions or prolapsed vaginal walls.  Musculoskeletal:        General: Normal range of motion.     Cervical back: Normal range of motion and neck supple.  Skin:    General: Skin is warm and dry.  Neurological:     General: No focal deficit present.     Mental Status: She is alert  and oriented to person, place, and time.      UC Treatments / Results  Labs (all labs ordered are listed, but only abnormal results are displayed) Labs Reviewed  POCT URINALYSIS DIPSTICK, ED / UC - Abnormal; Notable for the following components:      Result Value   Hgb urine dipstick TRACE (*)    All other components within normal limits  URINE CULTURE    EKG   Radiology No results found.  Procedures Procedures (including critical care time)  Medications Ordered in UC Medications - No data to display  Initial Impression / Assessment and Plan / UC Course  I have reviewed the triage vital signs and the nursing notes.  Pertinent labs & imaging results that were available during my care of the patient were reviewed by me and considered in my medical decision making (see chart for details).    66 yo post-menopausal female who complains of burning with urination, hematuria, urinary urgency, and incomplete bladder emptying for 1 week. Patient denies back pain, flank pain, fever, nausea, vomiting, stomach ache, or vaginal discharge.  She is afebrile and nontoxic.  Urinalysis unremarkable and does not indicate an acute UTI.  Patient thinks that she has a busted blood vessel on the outside of her vagina.  Vaginal exam unremarkable does not show any sores, lesions or bleeding.  Patient prescribed Pyridium for the dysuria.  Urine cultures are pending.  Advised to follow-up with urology.  Today's evaluation has revealed no signs of a dangerous process. Discussed diagnosis with patient and/or guardian. Patient and/or guardian aware of their diagnosis, possible red flag symptoms to watch out for and need for close follow up. Patient and/or guardian understands verbal and written discharge instructions. Patient and/or guardian comfortable with plan and disposition.  Patient and/or guardian has a clear mental status at this time, good insight into illness (after discussion and teaching) and has  clear judgment to make decisions regarding their care  Documentation was completed with the aid of voice recognition software. Transcription may contain typographical errors. Final Clinical Impressions(s) / UC Diagnoses   Final diagnoses:  Dysuria  Urinary incontinence, unspecified type     Discharge Instructions      Take medication prescribed for the treatment of burning with urination. Cultures  of your urine are pending. We will prescribe you an antibiotic if the cultures show any bacterial growth. As for now, you do not need any antibiotics a this time. Follow-up with urologist. Call in the morning to arrange appointment.      ED Prescriptions     Medication Sig Dispense Auth. Provider   phenazopyridine (PYRIDIUM) 200 MG tablet Take 1 tablet (200 mg total) by mouth 3 (three) times daily. 6 tablet Lurline Idol, FNP      PDMP not reviewed this encounter.   Lurline Idol, [place] 12/13/21 1940

## 2021-12-13 NOTE — ED Triage Notes (Signed)
Pt is here for possible UTI and bleeding  causing pain and discomfort  and bladder issues x 1wk

## 2021-12-13 NOTE — Discharge Instructions (Addendum)
Take medication prescribed for the treatment of burning with urination. Cultures of your urine are pending. We will prescribe you an antibiotic if the cultures show any bacterial growth. As for now, you do not need any antibiotics a this time. Follow-up with urologist. Call in the morning to arrange appointment.

## 2021-12-15 LAB — URINE CULTURE: Culture: <10000 — AB

## 2022-09-02 ENCOUNTER — Encounter (HOSPITAL_COMMUNITY): Payer: Self-pay | Admitting: *Deleted

## 2022-09-02 ENCOUNTER — Ambulatory Visit (HOSPITAL_COMMUNITY)
Admission: EM | Admit: 2022-09-02 | Discharge: 2022-09-02 | Disposition: A | Discharge status: Home / Self Care | Payer: Medicare Other | Attending: Emergency Medicine | Admitting: Emergency Medicine

## 2022-09-02 DIAGNOSIS — M62830 Muscle spasm of back: Secondary | ICD-10-CM | POA: Diagnosis not present

## 2022-09-02 DIAGNOSIS — R0982 Postnasal drip: Secondary | ICD-10-CM

## 2022-09-02 DIAGNOSIS — F411 Generalized anxiety disorder: Secondary | ICD-10-CM

## 2022-09-02 MED ORDER — HYDROXYZINE HCL 25 MG PO TABS: 12.5000 mg | ORAL_TABLET | Freq: Three times a day (TID) | ORAL | 0 refills | Status: AC

## 2022-09-02 MED ORDER — BACLOFEN 10 MG PO TABS: 5.0000 mg | ORAL_TABLET | Freq: Three times a day (TID) | ORAL | 0 refills | Status: AC

## 2022-09-02 NOTE — Discharge Instructions (Addendum)
Take hydroxyzine as prescribed - this should help with your drainage which may be due to allergies. It can also help with your anxiety. Recommend avoiding foods with a lot of dairy as this can thicken mucus.  Take baclofen as prescribed as needed, this will help with the muscle spasms in your back.  Follow-up with PCP if minimal improvement. Go to the ER if severe worsening or difficulty breathing.

## 2022-09-02 NOTE — ED Triage Notes (Signed)
Pt states she has PND x 1 week. She states that she has had trouble swallowing since this has started. She feels like she is having muscle spasms and she can't get her food down when she swallows.   She states she feels like places she eat is putting thickener into the lemonade.

## 2022-09-02 NOTE — ED Provider Notes (Signed)
[facility]    CSN: [id number] Arrival date & time: 09/02/22  1724      History   Chief Complaint Chief Complaint  Patient presents with   Nasal Congestion   Dysphagia   Cough    HPI Ann Scott is a 67 y.o. female.   Patient presents with concerns of trouble swallowing. She reports she has had nasal congestion and post-nasal drainage for the past week or so. She states after drinking a milkshake she felt like the mucus got thicker and she had trouble swallowing. She states since then she has been afraid to eat or drink because she is worried she is not going to be able to swallow. The patient states she has not had much to eat or drink and this is making her feel a little shaky. The patient states when she tries to eat she gets very anxious and then feels like she can't swallow all the way, like there is something getting stuck in her esophagus or it is spasming. She has not tried anything for her symptoms.  The patient also states she has been having spasms in her back. She has scoliosis and states she has other back problems. She starts having spasms in her mid back, mainly on the left, after she walks for a while and states she doesn't currently have a car so she has been walking a lot.   The history is provided by the patient.  Cough Associated symptoms: no chest pain, no fever, no headaches, no rash, no shortness of breath and no sore throat     Past Medical History:  Diagnosis Date   Anxiety    Arthritis    Depression    Scoliosis     There are no problems to display for this patient.   Past Surgical History:  Procedure Laterality Date   ENDOMETRIAL ABLATION      OB History   No obstetric history on file.      Home Medications    Prior to Admission medications   Medication Sig Start Date End Date Taking? Authorizing Provider  baclofen (LIORESAL) 10 MG tablet Take 0.5-1 tablets (5-10 mg total) by mouth 3 (three) times daily. 09/02/22  Yes  Luretha Eberly L, PA  hydrOXYzine (ATARAX) 25 MG tablet Take 0.5-1 tablets (12.5-25 mg total) by mouth 3 (three) times daily for 7 days. 09/02/22 09/09/22 Yes Genell Thede L, PA  phenazopyridine (PYRIDIUM) 200 MG tablet Take 1 tablet (200 mg total) by mouth 3 (three) times daily. 12/13/21   Lurline Idol, FNP    Family History Family History  Problem Relation Age of Onset   Heart disease Mother    Cancer Father     Social History Social History   Tobacco Use   Smoking status: Never   Smokeless tobacco: Never  Vaping Use   Vaping status: Never Used  Substance Use Topics   Alcohol use: No   Drug use: No     Allergies   Effexor [venlafaxine], Prozac [fluoxetine], and Sulfa antibiotics   Review of Systems Review of Systems  Constitutional:  Negative for fatigue and fever.  HENT:  Positive for congestion, postnasal drip and trouble swallowing. Negative for sore throat.   Respiratory:  Positive for cough. Negative for shortness of breath.   Cardiovascular:  Negative for chest pain.  Gastrointestinal:  Negative for abdominal pain, nausea and vomiting.  Musculoskeletal:  Positive for back pain.  Skin:  Negative for rash.  Neurological:  Negative for  dizziness and headaches.  Psychiatric/Behavioral:  The patient is nervous/anxious.      Physical Exam Triage Vital Signs ED Triage Vitals  Encounter Vitals Group     BP 09/02/22 1910 (!) 147/77     Systolic BP Percentile --      Diastolic BP Percentile --      Pulse Rate 09/02/22 1910 70     Resp 09/02/22 1910 18     Temp 09/02/22 1910 97.9 F (36.6 C)     Temp Source 09/02/22 1910 Oral     SpO2 09/02/22 1910 100 %     Weight --      Height --      Head Circumference --      Peak Flow --      Pain Score 09/02/22 1907 0     Pain Loc --      Pain Education --      Exclude from Growth Chart --    No data found.  Updated Vital Signs BP (!) 147/77 (BP Location: Left Arm)   Pulse 70   Temp 97.9 F (36.6 C) (Oral)    Resp 18   SpO2 100%   Visual Acuity Right Eye Distance:   Left Eye Distance:   Bilateral Distance:    Right Eye Near:   Left Eye Near:    Bilateral Near:     Physical Exam Vitals and nursing note reviewed.  Constitutional:      General: She is not in acute distress. HENT:     Head: Normocephalic.     Nose: Congestion present. No rhinorrhea.     Mouth/Throat:     Mouth: Mucous membranes are moist.     Pharynx: Oropharynx is clear. Uvula midline. Postnasal drip present. No uvula swelling.  Eyes:     Conjunctiva/sclera: Conjunctivae normal.     Pupils: Pupils are equal, round, and reactive to light.  Cardiovascular:     Rate and Rhythm: Normal rate and regular rhythm.     Heart sounds: Normal heart sounds.  Pulmonary:     Effort: Pulmonary effort is normal.     Breath sounds: Normal breath sounds. No stridor. No wheezing, rhonchi or rales.  Abdominal:     Tenderness: There is no abdominal tenderness.  Musculoskeletal:     Cervical back: Normal range of motion.     Comments: Scoliosis. No current tenderness or spasms.   Lymphadenopathy:     Cervical: No cervical adenopathy.  Neurological:     General: No focal deficit present.     Mental Status: She is alert.  Psychiatric:        Mood and Affect: Mood is anxious.        Speech: Speech is rapid and pressured.      UC Treatments / Results  Labs (all labs ordered are listed, but only abnormal results are displayed) Labs Reviewed - No data to display  EKG   Radiology No results found.  Procedures Procedures (including critical care time)  Medications Ordered in UC Medications - No data to display  Initial Impression / Assessment and Plan / UC Course  I have reviewed the triage vital signs and the nursing notes.  Pertinent labs & imaging results that were available during my care of the patient were reviewed by me and considered in my medical decision making (see chart for details).    Patient with severe  anxiety. She is handling her saliva well and no objective evidence of abnormal swallowing. Globus  sensation likely due to anxiety, possibly initially precipitated by thicker PND after having a milkshake. Hydroxyzine Rx to assist with PND likely secondary to allergies, and also assist with anxiety. Pt with scoliosis and recurrent back spasms that also cause her anxiety. Baclofen prescribed to help with this. Encouraged pt to follow-up with a PCP especially for her anxiety. ER precautions discussed.   E/M: 1 acute uncomplicated illness, no data, moderate risk due to prescription management   Final Clinical Impressions(s) / UC Diagnoses   Final diagnoses:  Post-nasal drainage  Spasm of back muscles  Anxiety state     Discharge Instructions      Take hydroxyzine as prescribed - this should help with your drainage which may be due to allergies. It can also help with your anxiety. Recommend avoiding foods with a lot of dairy as this can thicken mucus.  Take baclofen as prescribed as needed, this will help with the muscle spasms in your back.  Follow-up with PCP if minimal improvement. Go to the ER if severe worsening or difficulty breathing.     ED Prescriptions     Medication Sig Dispense Auth. Provider   hydrOXYzine (ATARAX) 25 MG tablet Take 0.5-1 tablets (12.5-25 mg total) by mouth 3 (three) times daily for 7 days. 21 tablet Vallery Sa, Wendal Wilkie L, PA   baclofen (LIORESAL) 10 MG tablet Take 0.5-1 tablets (5-10 mg total) by mouth 3 (three) times daily. 30 each Vallery Sa, Cionna Collantes L, PA      PDMP not reviewed this encounter.   Estanislado Pandy, [place] 09/02/22 [phone]

## 2022-09-03 ENCOUNTER — Encounter (HOSPITAL_COMMUNITY): Payer: Self-pay

## 2022-09-03 ENCOUNTER — Other Ambulatory Visit: Payer: Self-pay

## 2022-09-03 ENCOUNTER — Emergency Department (HOSPITAL_COMMUNITY)
Admission: EM | Admit: 2022-09-03 | Discharge: 2022-09-03 | Disposition: A | Discharge status: Home / Self Care | Payer: Medicare Other | Attending: Emergency Medicine | Admitting: Emergency Medicine

## 2022-09-03 ENCOUNTER — Emergency Department (HOSPITAL_COMMUNITY): Payer: Medicare Other

## 2022-09-03 DIAGNOSIS — R1310 Dysphagia, unspecified: Secondary | ICD-10-CM | POA: Insufficient documentation

## 2022-09-03 DIAGNOSIS — F419 Anxiety disorder, unspecified: Secondary | ICD-10-CM | POA: Diagnosis not present

## 2022-09-03 DIAGNOSIS — M419 Scoliosis, unspecified: Secondary | ICD-10-CM | POA: Diagnosis not present

## 2022-09-03 LAB — CBC WITH DIFFERENTIAL/PLATELET
Abs Immature Granulocytes: 0.02 10*3/uL (ref 0.00–0.07)
Basophils Absolute: 0.1 10*3/uL (ref 0.0–0.1)
Basophils Relative: 1 %
Eosinophils Absolute: 0.1 10*3/uL (ref 0.0–0.5)
Eosinophils Relative: 1 %
HCT: 39.1 % (ref 36.0–46.0)
Hemoglobin: 12.8 g/dL (ref 12.0–15.0)
Immature Granulocytes: 0 %
Lymphocytes Relative: 30 %
Lymphs Abs: 2.1 10*3/uL (ref 0.7–4.0)
MCH: 29.6 pg (ref 26.0–34.0)
MCHC: 32.7 g/dL (ref 30.0–36.0)
MCV: 90.5 fL (ref 80.0–100.0)
Monocytes Absolute: 0.5 10*3/uL (ref 0.1–1.0)
Monocytes Relative: 7 %
Neutro Abs: 4.2 10*3/uL (ref 1.7–7.7)
Neutrophils Relative %: 61 %
Platelets: 242 10*3/uL (ref 150–400)
RBC: 4.32 MIL/uL (ref 3.87–5.11)
RDW: 12.5 % (ref 11.5–15.5)
WBC: 6.9 10*3/uL (ref 4.0–10.5)
nRBC: 0 % (ref 0.0–0.2)

## 2022-09-03 LAB — COMPREHENSIVE METABOLIC PANEL
ALT: 17 U/L (ref 0–44)
AST: 18 U/L (ref 15–41)
Albumin: 4.4 g/dL (ref 3.5–5.0)
Alkaline Phosphatase: 68 U/L (ref 38–126)
Anion gap: 7 (ref 5–15)
BUN: 13 mg/dL (ref 8–23)
CO2: 24 mmol/L (ref 22–32)
Calcium: 9.5 mg/dL (ref 8.9–10.3)
Chloride: 104 mmol/L (ref 98–111)
Creatinine, Ser: 0.75 mg/dL (ref 0.44–1.00)
GFR, Estimated: >60 mL/min (ref >60)
Glucose, Bld: 91 mg/dL (ref 70–99)
Potassium: 3.8 mmol/L (ref 3.5–5.1)
Sodium: 135 mmol/L (ref 135–145)
Total Bilirubin: 0.8 mg/dL (ref 0.3–1.2)
Total Protein: 7.4 g/dL (ref 6.5–8.1)

## 2022-09-03 NOTE — ED Provider Notes (Addendum)
Seen from previous provider.  See note for HPI.  In summation 67 year old who has had forted difficulty swallowing since she was a child here for evaluation of similar.  Apparently Velp some postnasal drip and an increase in symptoms.  Per previous Clinical research associate patient very anxious.  She has not followed with primary care provider in quite some time.    Imaging per previous provider did not show any significant abnormality.  They did not feel patient needs CT imaging, will get labs given she has not been seen in quite some time.  If negative will have her follow-up outpatient Physical Exam  BP 130/80   Pulse 83   Temp 98 F (36.7 C) (Oral)   Resp 18   Ht 5\' 3"  (1.6 m)   Wt 54.4 kg   SpO2 98%   BMI 21.26 kg/m   Physical Exam Vitals and nursing note reviewed.  Constitutional:      General: She is not in acute distress.    Appearance: She is well-developed. She is not ill-appearing.  HENT:     Head: Atraumatic.     Mouth/Throat:     Lips: Pink.     Mouth: Mucous membranes are moist.     Tongue: No lesions. Tongue does not deviate from midline.     Palate: No mass and lesions.     Pharynx: Oropharynx is clear. Uvula midline.     Tonsils: No tonsillar exudate or tonsillar abscesses. 0 on the right. 0 on the left.     Comments: P.o. clear.  Uvula midline.  Tolerating secretions.  Sublingual area soft Eyes:     Pupils: Pupils are equal, round, and reactive to light.  Neck:     Trachea: Trachea and phonation normal.     Comments: Full range of motion, no bruit.  No overlying skin changes, edema Cardiovascular:     Rate and Rhythm: Normal rate.  Pulmonary:     Effort: No respiratory distress.  Abdominal:     General: There is no distension.  Musculoskeletal:        General: Normal range of motion.     Cervical back: Full passive range of motion without pain and normal range of motion.  Skin:    General: Skin is warm and dry.  Neurological:     General: No focal deficit present.      Mental Status: She is alert.  Psychiatric:        Mood and Affect: Mood normal.    Procedures  Procedures Labs Reviewed  CBC WITH DIFFERENTIAL/PLATELET  COMPREHENSIVE METABOLIC PANEL   DG Neck Soft Tissue  Result Date: 09/03/2022 CLINICAL DATA:  Difficulty swallowing intermittently, initial encounter EXAM: NECK SOFT TISSUES - 1+ VIEW COMPARISON:  None Available. FINDINGS: Seven cervical segments are well visualized. Vertebral body height is well maintained. Disc space narrowing and osteophytic changes are noted at C5-6. No prevertebral soft tissue swelling is noted. No other focal abnormality is seen. No foreign body is seen. IMPRESSION: No acute abnormality noted. Electronically Signed   By: Alcide Clever M.D.   On: 09/03/2022 19:20   DG Chest 2 View  Result Date: 09/03/2022 CLINICAL DATA:  Shortness of breath EXAM: CHEST - 2 VIEW COMPARISON:  04/05/2015 FINDINGS: Cardiac shadow is within normal limits. The lungs are well aerated bilaterally. No focal infiltrate or effusion is seen. Scoliosis of the thoracolumbar spine is again seen and stable. IMPRESSION: No acute abnormality noted. Electronically Signed   By: Eulah Pont.D.  On: 09/03/2022 19:19    ED Course / MDM   Clinical Course as of 09/03/22 2210  Sat Sep 03, 2022  1936 FU on labs, dc home if neg with PCP/ ENT FU [BH]    Clinical Course User Index [BH] Hulon Ferron A, PA-C    Seen from previous provider.  See note for HPI.  In summation 67 year old who has had forted difficulty swallowing since she was a child here for evaluation of similar.  Apparently Velp some postnasal drip and an increase in symptoms.  Per previous Clinical research associate patient very anxious.  She has not followed with primary care provider in quite some time.    Imaging per previous provider did not show any significant abnormality.  They did not feel patient needs CT imaging, will get labs given she has not been seen in quite some time.  If negative will have her  follow-up outpatient  Labs and imaging without significant findings  Will have her establish care with PCP, return for new or worsening symptoms  The patient has been appropriately medically screened and/or stabilized in the ED. I have low suspicion for any other emergent medical condition which would require further screening, evaluation or treatment in the ED or require inpatient management.  Patient is hemodynamically stable and in no acute distress.  Patient able to ambulate in department prior to ED.  Evaluation does not show acute pathology that would require ongoing or additional emergent interventions while in the emergency department or further inpatient treatment.  I have discussed the diagnosis with the patient and answered all questions.  Pain is been managed while in the emergency department and patient has no further complaints prior to discharge.  Patient is comfortable with plan discussed in room and is stable for discharge at this time.  I have discussed strict return precautions for returning to the emergency department.  Patient was encouraged to follow-up with PCP/specialist refer to at discharge.    Medical Decision Making Amount and/or Complexity of Data Reviewed External Data Reviewed: labs, radiology and notes. Labs: ordered. Decision-making details documented in ED Course. Radiology: ordered and independent interpretation performed. Decision-making details documented in ED Course.  Risk OTC drugs. Prescription drug management. Diagnosis or treatment significantly limited by social determinants of health.       Brannon Decaire A, PA-C 09/03/22 2241    Laurence Spates, MD 09/03/22 [phone]

## 2022-09-03 NOTE — ED Triage Notes (Signed)
Patient reports pain with swallowing intermittently x "years." Patient seen at [facility] for same and dx with postnasal drip and muscle spasms. Patient able to swallow secretions and talk in full sentences.

## 2022-09-03 NOTE — Discharge Instructions (Addendum)
Follow up with ENT for evaluation.  Schedule appointment to see primary care for evaluation   Return for new or worsening symptoms

## 2022-09-03 NOTE — ED Provider Notes (Signed)
EMERGENCY DEPARTMENT AT [facility] Provider Note   CSN: [id number] Arrival date & time: 09/03/22  1804     History  Chief Complaint  Patient presents with   Dysphagia    Ann Scott is a 67 y.o. female.  Patient complains of difficulty swallowing.  Patient reports that she has been having a popping sensation in her throat since she was a child.  Patient reports that something feels tight in her throat and she uses her hand to move it to the side and it feels better.  Patient reports that she was recently treated for postnasal drip.  Patient is concerned that the popping and difficulty swallowing that she experiences could be coming from a artery that she should not be moving.  Patient tells me that she lives alone.  She states she has not been eating and drinking normally.  She states this is partially to the difficulty swallowing because of her throat but also because she has not felt like taking care of herself.  Patient reports eating very little drinking very little.  Patient reports that she has not been bathing or taking care of herself.  Patient reports that she does not have any friends in the community.  She states that she has a sister but her sister will not answer the phone when she calls.  Patient has not seen a primary care physician in over 7 years.  Patient reports she has lost approximately 25 pounds in the last year.  Patient reports all of her close are large on her.  Patient admits to anxiety.  Patient very anxious about coming to the emergency department.  The history is provided by the patient. No language interpreter was used.       Home Medications Prior to Admission medications   Medication Sig Start Date End Date Taking? Authorizing Provider  baclofen (LIORESAL) 10 MG tablet Take 0.5-1 tablets (5-10 mg total) by mouth 3 (three) times daily. 09/02/22   Vallery Sa, Amy L, PA  hydrOXYzine (ATARAX) 25 MG tablet Take 0.5-1 tablets (12.5-25 mg  total) by mouth 3 (three) times daily for 7 days. 09/02/22 09/09/22  Vallery Sa, Amy L, PA  phenazopyridine (PYRIDIUM) 200 MG tablet Take 1 tablet (200 mg total) by mouth 3 (three) times daily. 12/13/21   Lurline Idol, FNP      Allergies    Effexor [venlafaxine], Prozac [fluoxetine], and Sulfa antibiotics    Review of Systems   Review of Systems  HENT:  Positive for trouble swallowing.   All other systems reviewed and are negative.   Physical Exam Updated Vital Signs BP 130/80   Pulse 83   Temp 98 F (36.7 C) (Oral)   Resp 18   Ht 5\' 3"  (1.6 m)   Wt 54.4 kg   SpO2 98%   BMI 21.26 kg/m  Physical Exam Vitals and nursing note reviewed.  Constitutional:      Appearance: She is well-developed.  HENT:     Head: Normocephalic.     Mouth/Throat:     Mouth: Mucous membranes are moist.  Eyes:     Pupils: Pupils are equal, round, and reactive to light.  Cardiovascular:     Rate and Rhythm: Normal rate.  Pulmonary:     Effort: Pulmonary effort is normal.  Abdominal:     General: There is no distension.  Musculoskeletal:        General: Normal range of motion.     Cervical back: Normal range of  motion.  Neurological:     General: No focal deficit present.     Mental Status: She is alert and oriented to person, place, and time.  Psychiatric:        Mood and Affect: Mood normal.     ED Results / Procedures / Treatments   Labs (all labs ordered are listed, but only abnormal results are displayed) Labs Reviewed  CBC WITH DIFFERENTIAL/PLATELET  COMPREHENSIVE METABOLIC PANEL    EKG None  Radiology No results found.  Procedures Procedures    Medications Ordered in ED Medications - No data to display  ED Course/ Medical Decision Making/ A&P Clinical Course as of 09/03/22 1937  Sat Sep 03, 2022  1936 FU on labs, dc home if neg with PCP/ ENT FU [BH]    Clinical Course User Index [BH] Henderly, Britni A, PA-C                                 Medical Decision  Making Patient has multiple complaints, patient has difficulty swallowing which she describes as chronic, patient experiences some shortness of breath that she feels is from her scoliosis, patient reports weight loss and difficulty caring for herself.  Amount and/or Complexity of Data Reviewed Labs: ordered. Radiology: ordered.  Risk Risk Details: Patient has multiple complaints.  I will obtain lab evaluation and an x-ray of her chest.   Pt's care turned over to [facility]        Final Clinical Impression(s) / ED Diagnoses Final diagnoses:  Dysphagia, unspecified type    Rx / DC Orders ED Discharge Orders     None         Osie Cheeks 09/03/22 1941    Vanetta Mulders, MD 09/04/22 2256

## 2022-09-13 NOTE — Progress Notes (Signed)
Pt had a cbc to assess for anemia, wbc count and differential to evaluate for any abnormal cells causing symptoms.  This was an appropriate test

## 2022-10-26 ENCOUNTER — Institutional Professional Consult (permissible substitution) (INDEPENDENT_AMBULATORY_CARE_PROVIDER_SITE_OTHER): Payer: Medicare Other | Admitting: Otolaryngology

## 2023-11-03 ENCOUNTER — Emergency Department (HOSPITAL_COMMUNITY)
Admission: EM | Admit: 2023-11-03 | Discharge: 2023-11-03 | Disposition: A | Discharge status: Home / Self Care | Attending: Emergency Medicine | Admitting: Emergency Medicine

## 2023-11-03 ENCOUNTER — Encounter (HOSPITAL_COMMUNITY): Payer: Self-pay

## 2023-11-03 ENCOUNTER — Other Ambulatory Visit: Payer: Self-pay

## 2023-11-03 ENCOUNTER — Emergency Department (HOSPITAL_COMMUNITY)

## 2023-11-03 DIAGNOSIS — W06XXXA Fall from bed, initial encounter: Secondary | ICD-10-CM | POA: Insufficient documentation

## 2023-11-03 DIAGNOSIS — Z23 Encounter for immunization: Secondary | ICD-10-CM | POA: Diagnosis not present

## 2023-11-03 DIAGNOSIS — S0181XA Laceration without foreign body of other part of head, initial encounter: Secondary | ICD-10-CM | POA: Insufficient documentation

## 2023-11-03 DIAGNOSIS — W19XXXA Unspecified fall, initial encounter: Secondary | ICD-10-CM

## 2023-11-03 DIAGNOSIS — S0993XA Unspecified injury of face, initial encounter: Secondary | ICD-10-CM | POA: Diagnosis present

## 2023-11-03 MED ORDER — TETANUS-DIPHTH-ACELL PERTUSSIS 5-2-15.5 LF-MCG/0.5 IM SUSP
0.5000 mL | Freq: Once | INTRAMUSCULAR | Status: AC
  Administered 2023-11-03: 0.5 mL via INTRAMUSCULAR
  Filled 2023-11-03: qty 0.5

## 2023-11-03 MED ORDER — LIDOCAINE HCL (PF) 1 % IJ SOLN
30.0000 mL | Freq: Once | INTRAMUSCULAR | Status: AC
  Administered 2023-11-03: 30 mL
  Filled 2023-11-03: qty 30

## 2023-11-03 MED ORDER — IBUPROFEN 200 MG PO TABS
600.0000 mg | ORAL_TABLET | Freq: Once | ORAL | Status: AC
  Administered 2023-11-03: 600 mg via ORAL
  Filled 2023-11-03: qty 3

## 2023-11-03 MED ORDER — LIDOCAINE HCL (PF) 1 % IJ SOLN
30.0000 mL | Freq: Once | INTRAMUSCULAR | Status: DC
  Filled 2023-11-03 (×2): qty 30

## 2023-11-03 NOTE — Discharge Instructions (Addendum)
 It was a pleasure taking part in your care.  As we discussed, please return to ED or urgent care in 10 days for removal of sutures.  I am referring you to a PCP.  Please call [facility] to schedule appointment to be seen.  The CT scan of your face did show a lobulated mass which is most likely a mucosal retention cyst however you should have this further evaluated with CT scan of your neck with contrast.  They will be able to assist you with this at outpatient doctors office.  Please return to the ED with chest pain, shortness of breath, headache or blurred vision.  Read attached guide concerning laceration care.  Please purchase a blood pressure cuff and begin recording blood pressure at home.

## 2023-11-03 NOTE — ED Provider Notes (Addendum)
 [place] EMERGENCY DEPARTMENT AT [facility] Provider Note   CSN: [id number] Arrival date & time: 11/03/23  9662     Patient presents with: Ann Scott is a 68 y.o. female with history of anxiety, arthritis, depression.  Presents to ED complaining of fall from bed.  States that she was sleeping this evening when she woke up after falling out of her bed.  She reports that she fell asleep on the side of her bed and she believes that she simply slipped off of the bed due to her being close to the edge.  She is unsure if she hit her head.  She reports that she struck her chin and she immediately woke up.  She arrives with a laceration to her chin.  She is unsure of her last tetanus update.  Denies headache, neck pain, blurred vision.  Denies nausea, vomiting or abdominal pain.  Denies chest pain or shortness of breath.  Denies blood thinners.   Fall       Prior to Admission medications   Medication Sig Start Date End Date Taking? Authorizing Provider  baclofen  (LIORESAL ) 10 MG tablet Take 0.5-1 tablets (5-10 mg total) by mouth 3 (three) times daily. 09/02/22   Vear, Amy L, PA  phenazopyridine  (PYRIDIUM ) 200 MG tablet Take 1 tablet (200 mg total) by mouth 3 (three) times daily. 12/13/21   Murrill, Samantha, FNP    Allergies: Effexor [venlafaxine], Prozac [fluoxetine], and Sulfa antibiotics    Review of Systems  Updated Vital Signs BP (!) 175/83   Pulse 67   Temp (!) 97.4 F (36.3 C)   Resp 17   Ht 5' 3 (1.6 m)   Wt 59 kg   SpO2 100%   BMI 23.03 kg/m   Physical Exam Vitals and nursing note reviewed.  Constitutional:      General: She is not in acute distress.    Appearance: She is well-developed.  HENT:     Head: Normocephalic and atraumatic.  Eyes:     Conjunctiva/sclera: Conjunctivae normal.  Cardiovascular:     Rate and Rhythm: Normal rate and regular rhythm.     Heart sounds: No murmur heard. Pulmonary:     Effort: Pulmonary effort is  normal. No respiratory distress.     Breath sounds: Normal breath sounds.  Abdominal:     Palpations: Abdomen is soft.     Tenderness: There is no abdominal tenderness.  Musculoskeletal:        General: No swelling.     Cervical back: Neck supple.  Skin:    General: Skin is warm and dry.     Capillary Refill: Capillary refill takes less than 2 seconds.     Comments: 0.5 cm laceration to chin.  Neurological:     General: No focal deficit present.     Mental Status: She is alert and oriented to person, place, and time. Mental status is at baseline.     GCS: GCS eye subscore is 4. GCS verbal subscore is 5. GCS motor subscore is 6.     Cranial Nerves: Cranial nerves 2-12 are intact. No cranial nerve deficit.     Sensory: Sensation is intact. No sensory deficit.     Motor: Motor function is intact. No weakness.     Coordination: Coordination is intact. Heel to [facility] Test normal.     Comments: CN III through XII intact.  Intact finger-to-nose, heel-to-shin.  No pronator drift.  No slurred speech.  No facial  droop.  Equal strength throughout.  Equal sensation throughout.  PERRL.  Tracks cross midline.  Alert and oriented x 4.  Psychiatric:        Mood and Affect: Mood normal.     (all labs ordered are listed, but only abnormal results are displayed) Labs Reviewed - No data to display  EKG: None  Radiology: CT Cervical Spine Wo Contrast Result Date: 11/03/2023 EXAM: CT CERVICAL SPINE WITHOUT CONTRAST 11/03/2023 04:38:32 AM TECHNIQUE: CT of the cervical spine was performed without the administration of intravenous contrast. Multiplanar reformatted images are provided for review. Automated exposure control, iterative reconstruction, and/or weight based adjustment of the mA/kV was utilized to reduce the radiation dose to as low as reasonably achievable. COMPARISON: Face CT and head CT today reported separately. CLINICAL HISTORY: 68 year old female. Neck trauma. Moving around in bed due to back  pain, fell, and hit chin on vinyl flooring. FINDINGS: CERVICAL SPINE: BONES AND ALIGNMENT: No acute fracture or traumatic malalignment. Relatively maintained cervical lordosis. Chronic anterior C1 odontoid degeneration with subchondral sclerosis and osteophytosis. Congenital incomplete ossification of the posterior C1 ring, normal variant. DEGENERATIVE CHANGES: Chronic disc and endplate degeneration at C5-C6 and C6-C7. SOFT TISSUES: No prevertebral soft tissue swelling. No evidence of cervical lymphadenopathy. Vallecula described on face CT today separately. Mild apical lung scarring. IMPRESSION: 1. No acute traumatic injury identified in the cervical spine. 2. Chronic degeneration at C1-C2, C5-C6, and C6-C7. Electronically signed by: Helayne Hurst MD 11/03/2023 05:28 AM EDT RP Workstation: [id number]   CT Maxillofacial Wo Contrast Result Date: 11/03/2023 EXAM: CT OF THE FACE WITHOUT CONTRAST 11/03/2023 04:38:32 AM TECHNIQUE: CT of the face was performed without the administration of intravenous contrast. Multiplanar reformatted images are provided for review. Automated exposure control, iterative reconstruction, and/or weight based adjustment of the mA/kV was utilized to reduce the radiation dose to as low as reasonably achievable. COMPARISON: CT head and cervical spine today reported separately. CLINICAL HISTORY: 68 year old female. Facial trauma, blunt. Patient hit chin on vinyl flooring. Abrasion noted to chin. FINDINGS: FACIAL BONES: Chronic right TMJ degeneration. Underlying bone intact. No acute dental finding identified. No acute facial fracture. No mandibular dislocation. No suspicious bone lesion. ORBITS: Globes are intact. No acute traumatic injury. No inflammatory change. SINUSES AND MASTOIDS: No acute abnormality. SOFT TISSUES: Soft tissue laceration over the right mandible symphysis on series 9 image 6 and series 11 image 37. No measurable soft tissue hematoma there. Lobulated soft tissue  irregularity in the vallecula and along the ventral epiglottis which may be multiple small mucosal space retention cysts (series 3 images 14 and 16, series 7 image 39). Otherwise negative visible noncontrast deep soft tissue spaces of the face. IMPRESSION: 1. Soft tissue laceration over the right mandibular symphysis without underlying osseous injury or hematoma. 2. No acute fracture identified in the face. 3. Lobulated soft tissue irregularity in the vallecula and along the ventral epiglottis, possibly mucosal retention cysts, but Recommend routine follow up Neck CT with IV contrast to further characterize. Electronically signed by: Helayne Hurst MD 11/03/2023 05:25 AM EDT RP Workstation: [id number]   CT Head Wo Contrast Result Date: 11/03/2023 EXAM: CT HEAD WITHOUT CONTRAST 11/03/2023 04:38:32 AM TECHNIQUE: CT of the head was performed without the administration of intravenous contrast. Automated exposure control, iterative reconstruction, and/or weight based adjustment of the mA/kV was utilized to reduce the radiation dose to as low as reasonably achievable. COMPARISON: Head CT 04/30/2011. CLINICAL HISTORY: 68 year old female with moderate-severe head trauma. Chin abrasion.  Denies LOC, dizziness, blurry vision, and blood thinner use. FINDINGS: BRAIN AND VENTRICLES: No acute hemorrhage. No evidence of acute infarct. No hydrocephalus. No extra-axial collection. No mass effect or midline shift. Occipital bone arachnoid granulations are stable and normal variation. Brain volume is normal for age. Pincer hyperdensity gray white differentiation is within normal limits for age. Faint calcified atherosclerosis at the skull base. ORBITS: No acute abnormality. SINUSES: No acute abnormality. SOFT TISSUES AND SKULL: No acute soft tissue abnormality. No skull fracture. IMPRESSION: 1. No acute traumatic injury. 2. Normal for age non-contrast head CT. Electronically signed by: Helayne Hurst MD 11/03/2023 05:21 AM EDT RP  Workstation: [id number]    .Laceration Repair  Date/Time: 11/03/2023 5:54 AM  Performed by: Ruthell Lonni FALCON, PA-C Authorized by: Ruthell Lonni FALCON, PA-C   Consent:    Consent obtained:  Verbal   Consent given by:  Patient   Risks discussed:  Infection, need for additional repair, nerve damage, poor wound healing, poor cosmetic result and pain Universal protocol:    Patient identity confirmed:  Verbally with patient and arm band Anesthesia:    Anesthesia method:  Local infiltration   Local anesthetic:  Lidocaine 1% w/o epi Laceration details:    Location: Chin.   Length (cm):  1.5    Medications Ordered in the ED  lidocaine (PF) (XYLOCAINE) 1 % injection 30 mL (has no administration in time range)  ibuprofen  (ADVIL ) tablet 600 mg (600 mg Oral Given 11/03/23 0455)  Tdap (ADACEL) injection 0.5 mL (0.5 mLs Intramuscular Given 11/03/23 0511)     Medical Decision Making Amount and/or Complexity of Data Reviewed Radiology: ordered.  Risk OTC drugs. Prescription drug management.   68 year old female presents to ED after falling out of her bed and striking her chin causing laceration.  On exam she is afebrile and nontachycardic.  Lung sounds clear bilaterally, no hypoxia.  Abdomen soft and compressible.  Neuroexam at baseline.  1.5 cm laceration to chin.  Patient assessed with CT maxillofacial, head, cervical spine.  CT head unremarkable.  CT cervical spine unremarkable.  CT maxillofacial unremarkable for acute fracture or injury.  Does show a lobulated soft tissue irregularity in the vallecula and along the ventral epiglottis which is possibly a mucosal retention cyst however they recommend routine follow-up with neck CT with IV contrast to further characterize.  I advised the patient of exam findings and I advised her that I would refer her to PCP for further follow-up and investigation and she voiced understanding.  Her wound was repaired per procedure note.  She was  advised to return to ED or urgent care in 10 days for removal of sutures and wound check and she voiced understanding.  She was given return precautions and she voiced understanding.  She is stable to discharge home.  Prior to discharge, I did have conversation with patient regarding her blood pressure.  Currently here it is 175/83.  She denies a history of hypertension.  I had a shared decision-making conversation with the patient regarding starting her on blood pressure medication this evening.  She states that she wishes to follow-up outpatient and not be placed on blood pressure medication at this time.  She was given return precautions to include chest pain, shortness of breath, blurred vision or headache.  She was advised to purchase a blood pressure cuff and to begin recording blood pressure.  Advised to return to ED with a continuous blood pressure greater than 165.    Final diagnoses:  Fall, initial  encounter  Facial laceration, initial encounter    ED Discharge Orders     None            Ruthell Lonni JULIANNA DEVONNA 11/03/23 0606    Theadore Ozell HERO, MD 11/03/23 [phone]

## 2023-11-03 NOTE — ED Triage Notes (Signed)
 Pt BIB [facility] from home for a fall. Pt reports moving around in bed d/t back pain and stated all of a sudden she was in the floor. Pt hit chin on vinyl flooring. Abrasion noted to chin. Bleeding controlled before EMS arrived. Denies LOC, dizziness, blurry vision. Denies blood thinner use.

## 2023-11-14 ENCOUNTER — Ambulatory Visit (HOSPITAL_COMMUNITY)
Admission: EM | Admit: 2023-11-14 | Discharge: 2023-11-14 | Disposition: A | Discharge status: Home / Self Care | Attending: Family Medicine | Admitting: Family Medicine

## 2023-11-14 DIAGNOSIS — Z4802 Encounter for removal of sutures: Secondary | ICD-10-CM | POA: Diagnosis not present

## 2023-11-14 NOTE — ED Triage Notes (Signed)
 Pt in office today for suture removal 3 sutures in chin

## 2023-11-20 ENCOUNTER — Other Ambulatory Visit: Payer: Self-pay | Admitting: Family Medicine

## 2023-11-20 DIAGNOSIS — R9389 Abnormal findings on diagnostic imaging of other specified body structures: Secondary | ICD-10-CM

## 2023-11-27 ENCOUNTER — Other Ambulatory Visit

## 2023-11-30 ENCOUNTER — Ambulatory Visit
Admission: RE | Admit: 2023-11-30 | Discharge: 2023-11-30 | Disposition: A | Discharge status: Home / Self Care | Source: Ambulatory Visit | Attending: Family Medicine | Admitting: Family Medicine

## 2023-11-30 DIAGNOSIS — R9389 Abnormal findings on diagnostic imaging of other specified body structures: Secondary | ICD-10-CM

## 2023-11-30 MED ORDER — IOPAMIDOL (ISOVUE-300) INJECTION 61%
75.0000 mL | Freq: Once | INTRAVENOUS | Status: AC | PRN
  Administered 2023-11-30: 75 mL via INTRAVENOUS

## 2023-12-04 ENCOUNTER — Ambulatory Visit: Admitting: Family Medicine

## 2024-01-09 ENCOUNTER — Telehealth (INDEPENDENT_AMBULATORY_CARE_PROVIDER_SITE_OTHER): Payer: Self-pay

## 2024-01-09 NOTE — Telephone Encounter (Signed)
 Left message for patient to call back to reschedule 02/05/24 appointment - provider will be in surgery

## 2024-01-10 ENCOUNTER — Institutional Professional Consult (permissible substitution) (INDEPENDENT_AMBULATORY_CARE_PROVIDER_SITE_OTHER)

## 2024-02-02 ENCOUNTER — Telehealth (INDEPENDENT_AMBULATORY_CARE_PROVIDER_SITE_OTHER): Payer: Self-pay

## 2024-02-02 NOTE — Telephone Encounter (Signed)
 Left voicemail to reschedule appointment on 02/16/2024 with Anice due to him being out of office.

## 2024-02-05 ENCOUNTER — Institutional Professional Consult (permissible substitution) (INDEPENDENT_AMBULATORY_CARE_PROVIDER_SITE_OTHER)

## 2024-02-07 ENCOUNTER — Telehealth (INDEPENDENT_AMBULATORY_CARE_PROVIDER_SITE_OTHER): Payer: Self-pay

## 2024-02-07 NOTE — Telephone Encounter (Signed)
 I called and left a message that we need to move her upcoming appointment on 02/19/24 as Dr Anice will not be in the office that day. I requested a call back to schedule.

## 2024-02-16 ENCOUNTER — Institutional Professional Consult (permissible substitution) (INDEPENDENT_AMBULATORY_CARE_PROVIDER_SITE_OTHER)

## 2024-03-19 ENCOUNTER — Institutional Professional Consult (permissible substitution) (INDEPENDENT_AMBULATORY_CARE_PROVIDER_SITE_OTHER)
